# Patient Record
Sex: Female | Born: 1990
Health system: Southern US, Community
[De-identification: ages and names within clinical notes are randomized; demographics above are authoritative.]

## PROBLEM LIST (undated history)

## (undated) ENCOUNTER — Inpatient Hospital Stay (HOSPITAL_COMMUNITY): Payer: Self-pay

## (undated) DIAGNOSIS — I951 Orthostatic hypotension: Principal | ICD-10-CM

## (undated) DIAGNOSIS — F319 Bipolar disorder, unspecified: Secondary | ICD-10-CM

## (undated) DIAGNOSIS — F32A Depression, unspecified: Secondary | ICD-10-CM

## (undated) DIAGNOSIS — F329 Major depressive disorder, single episode, unspecified: Secondary | ICD-10-CM

## (undated) DIAGNOSIS — R000 Tachycardia, unspecified: Principal | ICD-10-CM

## (undated) HISTORY — PX: TONSILLECTOMY: SUR1361

## (undated) HISTORY — DX: Tachycardia, unspecified: R00.0

## (undated) HISTORY — DX: Orthostatic hypotension: I95.1

---
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
## 2008-06-23 ENCOUNTER — Other Ambulatory Visit: Admission: RE | Admit: 2008-06-23 | Discharge: 2008-06-23 | Payer: Self-pay | Admitting: Family Medicine

## 2008-12-31 ENCOUNTER — Encounter: Admission: RE | Admit: 2008-12-31 | Discharge: 2008-12-31 | Payer: Self-pay | Admitting: Gastroenterology

## 2009-07-12 ENCOUNTER — Other Ambulatory Visit: Admission: RE | Admit: 2009-07-12 | Discharge: 2009-07-12 | Payer: Self-pay | Admitting: Family Medicine

## 2009-08-25 ENCOUNTER — Encounter: Payer: Self-pay | Admitting: Internal Medicine

## 2009-10-18 ENCOUNTER — Encounter: Payer: Self-pay | Admitting: Internal Medicine

## 2009-11-03 ENCOUNTER — Encounter: Admission: RE | Admit: 2009-11-03 | Discharge: 2009-11-03 | Payer: Self-pay | Admitting: Family Medicine

## 2009-11-09 ENCOUNTER — Encounter: Payer: Self-pay | Admitting: Internal Medicine

## 2009-11-10 ENCOUNTER — Encounter: Payer: Self-pay | Admitting: Internal Medicine

## 2009-11-10 DIAGNOSIS — R55 Syncope and collapse: Secondary | ICD-10-CM | POA: Insufficient documentation

## 2009-11-10 DIAGNOSIS — R079 Chest pain, unspecified: Secondary | ICD-10-CM | POA: Insufficient documentation

## 2009-11-15 ENCOUNTER — Telehealth: Payer: Self-pay | Admitting: Internal Medicine

## 2009-11-18 DIAGNOSIS — R002 Palpitations: Secondary | ICD-10-CM | POA: Insufficient documentation

## 2009-11-25 ENCOUNTER — Emergency Department (HOSPITAL_COMMUNITY): Admission: EM | Admit: 2009-11-25 | Discharge: 2009-11-25 | Payer: Self-pay | Admitting: Emergency Medicine

## 2009-12-07 ENCOUNTER — Encounter: Payer: Self-pay | Admitting: Internal Medicine

## 2009-12-13 ENCOUNTER — Encounter: Payer: Self-pay | Admitting: Internal Medicine

## 2009-12-13 ENCOUNTER — Telehealth: Payer: Self-pay | Admitting: Internal Medicine

## 2009-12-21 ENCOUNTER — Ambulatory Visit (HOSPITAL_COMMUNITY): Admission: RE | Admit: 2009-12-21 | Discharge: 2009-12-21 | Payer: Self-pay | Admitting: Internal Medicine

## 2009-12-21 ENCOUNTER — Ambulatory Visit: Payer: Self-pay | Admitting: Internal Medicine

## 2010-01-06 ENCOUNTER — Ambulatory Visit (HOSPITAL_COMMUNITY): Admission: RE | Admit: 2010-01-06 | Discharge: 2010-01-06 | Payer: Self-pay | Admitting: Internal Medicine

## 2010-01-06 ENCOUNTER — Ambulatory Visit: Payer: Self-pay | Admitting: Internal Medicine

## 2010-04-04 ENCOUNTER — Encounter: Payer: Self-pay | Admitting: Internal Medicine

## 2010-04-11 ENCOUNTER — Ambulatory Visit: Payer: Self-pay | Admitting: Internal Medicine

## 2010-06-02 ENCOUNTER — Telehealth: Payer: Self-pay | Admitting: Internal Medicine

## 2010-06-09 ENCOUNTER — Telehealth: Payer: Self-pay | Admitting: Internal Medicine

## 2010-06-16 ENCOUNTER — Telehealth: Payer: Self-pay | Admitting: Internal Medicine

## 2010-06-16 ENCOUNTER — Ambulatory Visit: Payer: Self-pay | Admitting: Internal Medicine

## 2010-07-14 ENCOUNTER — Other Ambulatory Visit: Admission: RE | Admit: 2010-07-14 | Discharge: 2010-07-14 | Payer: Self-pay | Admitting: Family Medicine

## 2010-09-15 ENCOUNTER — Telehealth: Payer: Self-pay | Admitting: Internal Medicine

## 2010-10-25 NOTE — Progress Notes (Signed)
Summary: CALLING SCHEDULE A TILT TABLE TEST  Phone Note Call from Patient Call back at Home Phone [phone]   Caller: Patient Summary of Call: PT CALLING TO  SCHEDULE A TILT TABLE TEST. Initial call taken by: Judie Grieve,  November 15, 2009 2:32 PM  Follow-up for Phone Call        Pt needs to be seen by EP doctor before setting up tilt test.  Will have Marcelino Duster in scheduling call pt back and make sure she is aware.  She has a scheduled apt for 12/23/09 with Dr Ladona Ridgel Dennis Bast, RN, BSN  November 15, 2009 3:27 PM Dr Donnie Aho spoke with Dr Ladona Ridgel and Dr Ladona Ridgel has agreed to do Tilt Test without seeing pt in office first.  Tried to call pt to srt up.  left message on machine for her to call me to arrange test. Dennis Bast, RN, BSN  December 03, 2009 3:20 PM  returning call, please call pt back @ [phone], [name]  December 03, 2009 3:29 PM   Additional Follow-up for Phone Call Additional follow up Details #1::        Called patient and left message on machine [employer] RN BSN  December 07, 2009 12:08 PM  Called and let Dr York Spaniel nurse know that we can not get in touch with pt.  Left another message for her to call.  spoke with Dr Ladona Ridgel  he says let the pt call us. Dennis Bast, RN, BSN  December 10, 2009 5:07 PM

## 2010-10-25 NOTE — Miscellaneous (Signed)
  Clinical Lists Changes  Problems: Added new problem of SYNCOPE (ICD-780.2) Added new problem of CHEST PAIN UNSPECIFIED (ICD-786.50) Orders: Added new Referral order of EP Referral (Cardiology EP Ref ) - Signed Added new Referral order of CPX Test at [facility] (CPX Test) - Signed

## 2010-10-25 NOTE — Progress Notes (Signed)
Summary: question about medication  Phone Note Call from Patient Call back at Home Phone [phone]   Caller: Patient Summary of Call: Question about medication Initial call taken by: Judie Grieve,  June 02, 2010 8:39 AM  Follow-up for Phone Call        [facility] Scherrie Bateman, LPN  June 02, 2010 9:17 AM  pt returning nurse call [address]  June 03, 2010 11:48 AM  Pt. concerned that her new medication, Risperdal, would interfere with her diagnosis of POTS syndrome. After reviewing the S/E of this medication, I do not see any indications that prevent her from taking the medication. Advised her to carefully pay attention to her symtoms especially the syncopal episodes to make sure they do not get worse but if they do to call us for further evaluation of the medication.

## 2010-10-25 NOTE — Progress Notes (Signed)
Summary: request regarding health  Phone Note Call from Patient Call back at Home Phone [phone]   Caller: Patient Summary of Call: Pt request call regarding health Initial call taken by: Judie Grieve,  June 09, 2010 10:30 AM  Follow-up for Phone Call        pt state pots symptoms increasing again. still have cp daily but intesity greater. rsc from 10/20 to 9/22. Claris Gladden, RN, BSN

## 2010-10-25 NOTE — Letter (Signed)
Summary: [facility]  [facility] Note   Imported By: Roderic Ovens 12/27/2009 16:50:44  _____________________________________________________________________  External Attachment:    Type:   Image     Comment:   External Document

## 2010-10-25 NOTE — Letter (Signed)
Summary: [facility]  [facility] Note   Imported By: Roderic Ovens 12/30/2009 11:29:07  _____________________________________________________________________  External Attachment:    Type:   Image     Comment:   External Document

## 2010-10-25 NOTE — Letter (Signed)
Summary: Tilt Table Test  [facility], Main Office  [address]   [place], [place] [address]   Phone: [phone]  Fax: [phone]      Tilt Table Test   You are scheduled for a Tilt Table Test on 12/21/09 at 10:30am with Dr.Taylor.  1.  Please arrive at the [facility] at 8:00am on the day of your procedure.  This test will take approximately 2-3 hours.  Please bring your insurance cards with you to the hospital.    2.  Complete lab work at the hospital the day of test You do not have to fast.  3.  Do not have anything to eat or drink after midnight the night before your procedure.  4.  Remaining medications may be taken with a small amount of water. Allergies:  Codeine  5.  Please wear comfortable shoes and clothing (sneakers and slacks are preferred).  6.  An IV will be placed in order to give fluids and medicines if necessary.  7.  You may NOT drive home after the test.  Please make arrangements with family or a friend to take you home after the procedure.   9.  Wash your chest and neck with antibacterial soap the evening before and the morning of your procedure.  Rinse well.  *If you have ANY questions after you get home, please call the office at [phone]. Anselm Pancoast  *Every attempt is made to prevent procedures from being rescheduled.  Due to the nature of Electrophysiology, rescheduling can happen.  The physician is always aware and directs the staff when this occurs.

## 2010-10-25 NOTE — Letter (Signed)
Summary: [facility] Office Note  [facility] Office Note   Imported By: Lynford Humphrey [place] 12/30/2009 11:35:00  _____________________________________________________________________  External Attachment:    Type:   Image     Comment:   External Document

## 2010-10-25 NOTE — Assessment & Plan Note (Signed)
Summary: rov   Visit Type:  Follow-up Primary Provider:  Candise Bowens. Yehuda Mao, MD  CC:  Pt stopped taking Nadolol..  History of Present Illness: Carly Reyes is seen in followup for symptoms consistent with dysautonomia aggravated significantly by anxiety.  After we saw her a couple of months ago he began salt and fluid repletion with Gatorade salt tablets recumbent exercise. There is a dramatic improvement in her symptoms and in fact she felt better than she had in 6 years. She returned to work after her leave of absence expired a couple weeks later. After 3 or 4 days her symptoms began to return and have gradually progressed so she is now having recurrent syncope and she is quite aware of the high levels of anxiety associated with her work situation.  She is also taking the knee she is seeing a counselor/psychiatrist. Respirdal was recently added to her medical regime and this is concurrent with the worsening of her syncope  Current Medications (verified): 1)  Ocella 3-0.03 Mg Tabs (Drospirenone-Ethinyl Estradiol) .... Take One Tablet By Mouth Once Daily. 2)  Aspirin 81 Mg Tabs (Aspirin) .... Take One Tablet Once Daily 3)  Zoloft 100 Mg Tabs (Sertraline Hcl) .... Take One Tablet With 1/2 50 Mg Tablet 4)  Risperdal 0.25 Mg Tabs (Risperidone) .... Once Daily  Allergies: 1)  ! Codeine  Past History:  Past Medical History: Last updated: 11/18/2009 Current Problems:  PALPITATIONS (ICD-785.1) CHEST PAIN UNSPECIFIED (ICD-786.50) SYNCOPE (ICD-780.2)    Vital Signs:  Patient profile:   20 year old female Height:      67 inches Weight:      129 pounds BMI:     20.28 Pulse rate:   85 / minute BP sitting:   126 / 80  (left arm)  Vitals Entered By: Laurance Flatten CMA (June 16, 2010 12:48 PM) CC: Pt stopped taking Nadolol.   Physical Exam  General:  Well developed, well nourished, in no acute distress.   EKG  Procedure date:  06/16/2010  Findings:      sinus rhythm at  85 Intervals 0.12/0.08/0.39 Axis LXXVII Otherwise normal  Impression & Recommendations:  Problem # 1:  DYSAUTONOMIA (ICD-742.8) the patient continues to have symptoms of dysautonomia. There is a marked improvement initially and then worsening with return to work. There seems to be a great deal of anxiety associated with return to the work place.  She is working with the psychiatry team. I've asked her to query of them the possibility of medications that might help alleviate the anxiety in addition to the antidepressant effects in addition I have asked her to ask her herself whether or counseling in the context of a religious world you might be of benefit. We outlined the number of potentially catastrophic contributions to anxiety including abuse abortion drugs etc. all of which need to be considered.  We will see her again in 4 weeks as scheduled.

## 2010-10-25 NOTE — Progress Notes (Signed)
Summary: sch tilt table test  Phone Note Call from Patient Call back at Home Phone [phone]   Caller: Patient Reason for Call: Talk to Nurse Summary of Call: returning call Initial call taken by: [name],  December 13, 2009 2:21 PM  Follow-up for Phone Call        left message on machine for pt.  As I was leaving message she called back and said she never got any of our messages we had left for her regarding scheduling the Tilt test.  Will sch for 12/21/09. Dennis Bast, RN, BSN  December 13, 2009 2:32 PM Got test scheduled and called pt back left her message on voicemail to call me for her instructions. Dennis Bast, RN, BSN  December 13, 2009 3:57 PM spoke with pt gave her instructions over the phone Dennis Bast, RN, BSN  December 14, 2009 6:06 PM

## 2010-10-25 NOTE — Assessment & Plan Note (Signed)
Summary: nep/ pot/  pt has bcbs/ gd   CC:  nep/POTS/pt states symptoms have gotten gradually worse.  Especially worse in the last 4 weeks.  Pt not sure the cause.  Pt states she has had bouts with syncope since tilt table and but reports that the chest pain is what has gotten much worse.  History of Present Illness: Carly Reyes is seen at the request of Dr. Donnie Aho because of dysautonomia.  She is a 20 year old woman who has a history of syncope that dates back to March 2010. This first episode occurred a couple weeks after breaking up with her boyfriend of 3-1/2 years. It was associated with a sense of impending vomiting. She went to the bathroom collapsed in the bathroom and hit her head on the fixtures of the bathroom. She has continued to have episodes of syncope increasingly abrupt in onset. They mostly occur in the morning shortly but not immediately after awakening. She has been advised to increase her fluid intake when she first awakens, this without significant benefit. She showers in the morning but mostly after these episodes have occurred. Her a.m. diet is cold and small in volume. The recovery phase is characterized by confusion, fatigue and some  orthostatic intolerance.  She underwent tilt table testing by Dr. Ladona Ridgel. He described in his records A. increase in heart rate from the 90s to the 130 range and up into the 140 range. There was no loss of consciousness. the rest of her cardiac evaluation has included an ultrasound by Dr. Chales Abrahams that was normal, a 30 day event recorder by Dr. Donnie Aho that was normal, and a cardiopulmonary stress test by Dr. Dorthea Cove that was also normal. This latter was the development of chest pain associated with shortness of breath. This began last fall shortly after the initiation of Adderall which was started for ADD. She is continued to have problems with chest pain. She has been seen by GI an endoscopy that was normal, a trial of PPI therapy was unhelpful She  has been seen by her primary care physician multiple times and a diagnosis of "anxiety" has been made. She is been treated with anxiolytics as well as more recently with Zoloft. 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 The patient says that this as well as her history of panic attacks which she related to her prior relationships were "cured."  Current Medications (verified): 1)  Ocella 3-0.03 Mg Tabs (Drospirenone-Ethinyl Estradiol) .... Take One Tablet By Mouth Once Daily. 2)  Aspirin 81 Mg Tabs (Aspirin) .... Take One Tablet Once Daily 3)  Zoloft 100 Mg Tabs (Sertraline Hcl) .... Take One Tablet With 1/2 50 Mg Tablet 4)  Zoloft 50 Mg Tabs (Sertraline Hcl) .... Take 1/2 Tablet With A 100 Mg Tablets Once Daily 5)  Nadolol 20 Mg Tabs (Nadolol) .... Take One Tablet Once Daily  Allergies (verified): 1)  ! Codeine  Past History:  Past Medical History: Last updated: 11/18/2009 Current Problems:  PALPITATIONS (ICD-785.1) CHEST PAIN UNSPECIFIED (ICD-786.50) SYNCOPE (ICD-780.2)    Past Surgical History: Last updated: 11/18/2009 Tonsillectomy  Family History: Last updated: 04/11/2010 Negative FH of Diabetes, Hypertension, or Coronary Artery Disease  Social History: Last updated: 11/18/2009 Full Time Single  Tobacco Use - No.  Alcohol Use - no Regular Exercise - no  Family History: Negative FH of Diabetes, Hypertension, or Coronary Artery Disease  Vital Signs:  Patient profile:   20 year old female Height:      62  inches Weight:      129 pounds BMI:     20.28 Pulse rate:   98 / minute Pulse rhythm:   regular BP sitting:   124 / 81  (left arm) Cuff size:   regular  Vitals Entered By: Judithe Modest CMA (April 11, 2010 10:01 AM)  Physical Exam  General:  Well developed, well nourished, in no acute distress. Head:  normal HEENT Neck:  supple without thyromegaly Chest Wall:  no cva tenderness Lungs:  clear Heart:  regular rate and rhythm without  murmurs or gallups Abdomen:  soft nontender without midline pulsation or hepatomegaly Msk:  Back normal, normal gait. Muscle strength and tone normal. Pulses:  pulses normal in all 4 extremities Extremities:  No clubbing or cyanosis. Neurologic:  Alert and oriented x 3. Skin:  Intact without lesions or rashes. Cervical Nodes:  no LN Psych:  affect is engaging but unblinking   EKG  Procedure date:  04/04/2010  Findings:      sinus rhythm at 67 Intervals 0.13/0.07/0.42 Otherwise normal  Impression & Recommendations:  Problem # 1:  DYSAUTONOMIA (ICD-742.8) the patient has signs and symptoms consistent with POTS. We spent a long time discussing the pathophysiology particularly the importance of salt intake which has been deplete in her diet.  we also discussed the importance of exercise chiefly horizontal exercise. I gave her information for the POTS place and NDRF websites. I have also encouraged her to ask her older adults in her life as to whether anxiety/stress may be playing a part here it is a common contributor to her dysautonomia and will need attention if indeed present and contributing  Problem # 2:  PALPITATIONS (ICD-785.1) her palpitations are likely related to the above. Her now all dose is currently insufficient to help with her heart rate; however, her blood pressure is so low that higher doses are probably not yet attainable. Her updated medication list for this problem includes:    Aspirin 81 Mg Tabs (Aspirin) .Marland Kitchen... Take one tablet once daily    Nadolol 20 Mg Tabs (Nadolol) .Marland Kitchen... Take one tablet once daily  Problem # 3:  CHEST PAIN UNSPECIFIED (ICD-786.50) chest pain is likely consistent with her POTS. I would be inclined to dis continue her aspirin Her updated medication list for this problem includes:    Aspirin 81 Mg Tabs (Aspirin) .Marland Kitchen... Take one tablet once daily    Nadolol 20 Mg Tabs (Nadolol) .Marland Kitchen... Take one tablet once daily  Problem # 4:  SYNCOPE  (ICD-780.2) as above.  we also discussed therapeutic maneuvers which might help mitigate some of her syncope. Specifically, she is advised to raise the head of her bed 6 inches, 2 change her fluid intake from water to Gatorade/Powerade, and to obtain salt tablets which are available at [facility] and to take 2 of them her day. She was further encouraged to add salt to her food although she is somewhat reluctant to do this. She is also advised regarding the impact of heat on her condition and I further suggested that she talk to her gynecologist about going on birth control that might help to decrease her cyclical bleeding which has been aggravation factors for her symptoms Her updated medication list for this problem includes:    Aspirin 81 Mg Tabs (Aspirin) .Marland Kitchen... Take one tablet once daily    Nadolol 20 Mg Tabs (Nadolol) .Marland Kitchen... Take one tablet once daily

## 2010-10-25 NOTE — Progress Notes (Signed)
Summary: coming out of work for a month  Phone Note Call from Patient Call back at [employer] [phone]   Caller: Patient Reason for Call: Talk to Nurse Summary of Call: per pt called was seen today, wants coming out of work for about a month until she can get better.  Initial call taken by: Lorne Skeens,  June 16, 2010 4:01 PM  Follow-up for Phone Call        per Dr. Graciela Husbands pt needs to follow-up w/ PCP regarding leave of absence from work Follow-up by: Claris Gladden RN,  June 16, 2010 4:10 PM

## 2010-10-25 NOTE — Letter (Signed)
Summary: [facility]  [facility]   Imported By: Marylou Mccoy 04/22/2010 11:29:26  _____________________________________________________________________  External Attachment:    Type:   Image     Comment:   External Document

## 2010-10-27 NOTE — Progress Notes (Signed)
Summary: chest pain and sob  Phone Note Call from Patient Call back at Home Phone [phone]   Caller: Patient Reason for Call: Talk to Nurse Summary of Call: pt states she been having chest pain and sob for 3 days. she would like to talk to a nurse.   Initial call taken by: Roe Coombs,  September 15, 2010 2:01 PM  Follow-up for Phone Call        I talked with pt by telephone--pt states pain in the middle of her chest for 3 days--this has occurred off and on--sharp stabbing chest pain that lasts for 2-3 seconds associated with SOB and a little more dizziness,  , otherwise OK --she is asking if she should come for echo --I will forward to Dr Graciela Husbands for review Luana Shu

## 2010-12-19 LAB — URINALYSIS, ROUTINE W REFLEX MICROSCOPIC
Bilirubin Urine: NEGATIVE
Glucose, UA: NEGATIVE mg/dL
Hgb urine dipstick: NEGATIVE
Ketones, ur: NEGATIVE mg/dL
Nitrite: NEGATIVE
Protein, ur: NEGATIVE mg/dL
Specific Gravity, Urine: 1.014 (ref 1.005–1.030)
Urobilinogen, UA: 0.2 mg/dL (ref 0.0–1.0)
pH: 7 (ref 5.0–8.0)

## 2010-12-19 LAB — COMPREHENSIVE METABOLIC PANEL
ALT: 17 U/L (ref 0–35)
AST: 20 U/L (ref 0–37)
Albumin: 3.7 g/dL (ref 3.5–5.2)
Alkaline Phosphatase: 65 U/L (ref 39–117)
BUN: 6 mg/dL (ref 6–23)
CO2: 24 mEq/L (ref 19–32)
Calcium: 9 mg/dL (ref 8.4–10.5)
Chloride: 105 mEq/L (ref 96–112)
Creatinine, Ser: 0.6 mg/dL (ref 0.4–1.2)
GFR calc Af Amer: >60 mL/min (ref >60)
GFR calc non Af Amer: >60 mL/min (ref >60)
Glucose, Bld: 91 mg/dL (ref 70–99)
Potassium: 3.3 mEq/L — ABNORMAL LOW (ref 3.5–5.1)
Sodium: 136 mEq/L (ref 135–145)
Total Bilirubin: 0.7 mg/dL (ref 0.3–1.2)
Total Protein: 6.8 g/dL (ref 6.0–8.3)

## 2010-12-19 LAB — CBC
HCT: 36.7 % (ref 36.0–46.0)
Hemoglobin: 12.7 g/dL (ref 12.0–15.0)
MCHC: 34.5 g/dL (ref 30.0–36.0)
MCV: 86 fL (ref 78.0–100.0)
Platelets: 222 10*3/uL (ref 150–400)
RBC: 4.27 MIL/uL (ref 3.87–5.11)
RDW: 13.1 % (ref 11.5–15.5)
WBC: 4.2 10*3/uL (ref 4.0–10.5)

## 2010-12-19 LAB — DIFFERENTIAL
Basophils Absolute: 0 10*3/uL (ref 0.0–0.1)
Basophils Relative: 1 % (ref 0–1)
Eosinophils Absolute: 0 10*3/uL (ref 0.0–0.7)
Eosinophils Relative: 1 % (ref 0–5)
Lymphocytes Relative: 40 % (ref 12–46)
Lymphs Abs: 1.7 10*3/uL (ref 0.7–4.0)
Monocytes Absolute: 0.3 10*3/uL (ref 0.1–1.0)
Monocytes Relative: 8 % (ref 3–12)
Neutro Abs: 2.1 10*3/uL (ref 1.7–7.7)
Neutrophils Relative %: 50 % (ref 43–77)

## 2010-12-19 LAB — HCG, SERUM, QUALITATIVE: Preg, Serum: NEGATIVE

## 2010-12-19 LAB — POCT PREGNANCY, URINE: Preg Test, Ur: NEGATIVE

## 2010-12-19 LAB — RAPID URINE DRUG SCREEN, HOSP PERFORMED
Amphetamines: NOT DETECTED
Barbiturates: NOT DETECTED
Benzodiazepines: NOT DETECTED
Cocaine: NOT DETECTED
Opiates: NOT DETECTED
Tetrahydrocannabinol: NOT DETECTED

## 2010-12-19 LAB — D-DIMER, QUANTITATIVE: D-Dimer, Quant: <0.22 ug/mL-FEU (ref 0.00–0.48)

## 2012-09-24 ENCOUNTER — Other Ambulatory Visit: Payer: Self-pay | Admitting: Family Medicine

## 2012-09-24 ENCOUNTER — Other Ambulatory Visit (HOSPITAL_COMMUNITY)
Admission: RE | Admit: 2012-09-24 | Discharge: 2012-09-24 | Disposition: A | Discharge status: Home / Self Care | Payer: Self-pay | Source: Ambulatory Visit | Attending: Family Medicine | Admitting: Family Medicine

## 2012-09-24 DIAGNOSIS — Z124 Encounter for screening for malignant neoplasm of cervix: Secondary | ICD-10-CM | POA: Insufficient documentation

## 2013-06-12 LAB — OB RESULTS CONSOLE HIV ANTIBODY (ROUTINE TESTING): HIV: NONREACTIVE

## 2013-06-12 LAB — OB RESULTS CONSOLE RUBELLA ANTIBODY, IGM: Rubella: IMMUNE

## 2013-06-27 LAB — OB RESULTS CONSOLE ABO/RH: RH Type: POSITIVE

## 2013-06-27 LAB — OB RESULTS CONSOLE ANTIBODY SCREEN: Antibody Screen: NEGATIVE

## 2013-06-27 LAB — OB RESULTS CONSOLE HEPATITIS B SURFACE ANTIGEN: Hepatitis B Surface Ag: NEGATIVE

## 2013-06-27 LAB — OB RESULTS CONSOLE RPR: RPR: NONREACTIVE

## 2013-06-30 ENCOUNTER — Emergency Department (HOSPITAL_COMMUNITY)
Admission: EM | Admit: 2013-06-30 | Discharge: 2013-06-30 | Disposition: A | Discharge status: Home / Self Care | Payer: Self-pay | Attending: Emergency Medicine | Admitting: Emergency Medicine

## 2013-06-30 ENCOUNTER — Encounter (HOSPITAL_COMMUNITY): Payer: Self-pay

## 2013-06-30 DIAGNOSIS — R0789 Other chest pain: Secondary | ICD-10-CM | POA: Insufficient documentation

## 2013-06-30 DIAGNOSIS — Z87891 Personal history of nicotine dependence: Secondary | ICD-10-CM | POA: Insufficient documentation

## 2013-06-30 DIAGNOSIS — F319 Bipolar disorder, unspecified: Secondary | ICD-10-CM | POA: Insufficient documentation

## 2013-06-30 DIAGNOSIS — Z8619 Personal history of other infectious and parasitic diseases: Secondary | ICD-10-CM | POA: Insufficient documentation

## 2013-06-30 DIAGNOSIS — R0602 Shortness of breath: Secondary | ICD-10-CM | POA: Insufficient documentation

## 2013-06-30 DIAGNOSIS — Z79899 Other long term (current) drug therapy: Secondary | ICD-10-CM | POA: Insufficient documentation

## 2013-06-30 DIAGNOSIS — R002 Palpitations: Secondary | ICD-10-CM | POA: Insufficient documentation

## 2013-06-30 DIAGNOSIS — R079 Chest pain, unspecified: Secondary | ICD-10-CM

## 2013-06-30 HISTORY — DX: Bipolar disorder, unspecified: F31.9

## 2013-06-30 LAB — COMPREHENSIVE METABOLIC PANEL
ALT: 25 U/L (ref 0–35)
AST: 15 U/L (ref 0–37)
Albumin: 3.9 g/dL (ref 3.5–5.2)
Alkaline Phosphatase: 72 U/L (ref 39–117)
BUN: 5 mg/dL — ABNORMAL LOW (ref 6–23)
CO2: 25 mEq/L (ref 19–32)
Calcium: 9.5 mg/dL (ref 8.4–10.5)
Chloride: 100 mEq/L (ref 96–112)
Creatinine, Ser: 0.54 mg/dL (ref 0.50–1.10)
GFR calc Af Amer: >90 mL/min (ref >90)
GFR calc non Af Amer: >90 mL/min (ref >90)
Glucose, Bld: 92 mg/dL (ref 70–99)
Potassium: 3.2 mEq/L — ABNORMAL LOW (ref 3.5–5.1)
Sodium: 136 mEq/L (ref 135–145)
Total Bilirubin: 0.2 mg/dL — ABNORMAL LOW (ref 0.3–1.2)
Total Protein: 7.2 g/dL (ref 6.0–8.3)

## 2013-06-30 LAB — URINALYSIS, ROUTINE W REFLEX MICROSCOPIC
Bilirubin Urine: NEGATIVE
Glucose, UA: NEGATIVE mg/dL
Hgb urine dipstick: NEGATIVE
Ketones, ur: NEGATIVE mg/dL
Leukocytes, UA: NEGATIVE
Nitrite: NEGATIVE
Protein, ur: NEGATIVE mg/dL
Specific Gravity, Urine: 1.021 (ref 1.005–1.030)
Urobilinogen, UA: 0.2 mg/dL (ref 0.0–1.0)
pH: 7 (ref 5.0–8.0)

## 2013-06-30 LAB — TROPONIN I: Troponin I: <0.3 ng/mL (ref <0.30)

## 2013-06-30 LAB — CBC
HCT: 33.9 % — ABNORMAL LOW (ref 36.0–46.0)
Hemoglobin: 12 g/dL (ref 12.0–15.0)
MCH: 29.1 pg (ref 26.0–34.0)
MCHC: 35.4 g/dL (ref 30.0–36.0)
MCV: 82.3 fL (ref 78.0–100.0)
Platelets: 309 10*3/uL (ref 150–400)
RBC: 4.12 MIL/uL (ref 3.87–5.11)
RDW: 12.7 % (ref 11.5–15.5)
WBC: 10.4 10*3/uL (ref 4.0–10.5)

## 2013-06-30 LAB — D-DIMER, QUANTITATIVE: D-Dimer, Quant: <0.27 ug/mL-FEU (ref 0.00–0.48)

## 2013-06-30 MED ORDER — FAMOTIDINE 20 MG PO TABS: 20.0000 mg | ORAL_TABLET | Freq: Two times a day (BID) | ORAL | Status: DC

## 2013-06-30 MED ORDER — SODIUM CHLORIDE 0.9 % IV BOLUS (SEPSIS)
1000.0000 mL | Freq: Once | INTRAVENOUS | Status: AC
  Administered 2013-06-30: 1000 mL via INTRAVENOUS

## 2013-06-30 MED ORDER — POTASSIUM CHLORIDE CRYS ER 20 MEQ PO TBCR: 20.0000 meq | EXTENDED_RELEASE_TABLET | Freq: Every day | ORAL | Status: DC

## 2013-06-30 MED ORDER — POTASSIUM CHLORIDE CRYS ER 20 MEQ PO TBCR
40.0000 meq | EXTENDED_RELEASE_TABLET | Freq: Once | ORAL | Status: AC
  Administered 2013-06-30: 40 meq via ORAL
  Filled 2013-06-30: qty 2

## 2013-06-30 NOTE — ED Notes (Signed)
Patient has a history of POTTS and is [redacted] weeks pregnant. Patient states she had a HR this AM 130 and currently HR-90. Patient is also having chest pain.

## 2013-06-30 NOTE — ED Notes (Signed)
[place]augh, PA at bedside.  

## 2013-06-30 NOTE — Progress Notes (Signed)
[facility] provided pt with a list of primary care resources. Patient stated that she was pending Medicaid.  °

## 2013-06-30 NOTE — ED Provider Notes (Signed)
CSN: [id number]     Arrival date & time 06/30/13  1300 History   First MD Initiated Contact with Patient 06/30/13 1505     Chief Complaint  Patient presents with  . Chest Pain  . Shortness of Breath   (Consider location/radiation/quality/duration/timing/severity/associated sxs/prior Treatment) Patient is a 22 y.o. female presenting with chest pain and shortness of breath. The history is provided by the patient, a parent and a significant other. No language interpreter was used.  Chest Pain Associated symptoms: palpitations and shortness of breath   Associated symptoms: no abdominal pain, no back pain, no cough, no diaphoresis, no fatigue, no fever, no headache, no nausea and not vomiting   Shortness of Breath Associated symptoms: chest pain   Associated symptoms: no abdominal pain, no cough, no diaphoresis, no fever, no headaches, no neck pain, no rash, no vomiting and no wheezing     BRYCELYNN STAMPLEY is a 22 y.o. female  with a hx of POTTS, bipolar disorder, anxiety  presents to the Emergency Department complaining of gradual, waxing and waning but never resolving sharp chest pain and associated shortness of breath beginning this morning upon wakening and 9 AM. Patient states she took clonazepam 1 mg 4 times per day for anxiety prior to finding out she was pregnant. She reports 2 weeks ago she stopped clonazepam without a taper and had chest pain, shortness of breath, tremors, diaphoresis, nausea, vomiting and diarrhea. She states when she talked to her psychiatrist about this 2 days ago he told her she was withdrawing and put her back on the clonazepam 0.5 mg every 12 hours.  She reports this morning when she woke her chest pain and shortness of breath felt like her usual POTTS and not like her anxiety, but she became anxious and took 2 extra 0.5mg  Klonopin today. She states her symptoms have resolved except for her shortness of breath which persists.  She reports multiple episodes of vomiting  per day (up to 6 per day) before restarting in the clonazepam 2 days ago and less than 2 per day since that time.  She reports her OB/GYN is monitoring her clonazepam taper and prescribed Phenergan for her vomiting. She reports she is drinking plenty of water, but is not taking in extra salt. Patient reports this morning she attempted drinking water, putting her feet up and nothing seemed to make her symptoms better or worse. She reports her heart rate was 130 this morning prior to taking her extra doses of clonazepam.  She reports she sees [facility] who initially had her on nadolol for her POTTS, but this caused her BP to be too low therefore she was removed from this medication.  She currently takes no medications for her POTTS.  She also reports "acid wash" worse in the mornings and evenings not relieved by Tums.   Past Medical History  Diagnosis Date  . Pott's disease     . Bipolar 1 disorder    Past Surgical History  Procedure Laterality Date  . Tonsillectomy     Family History  Problem Relation Age of Onset  . Hypertension Mother   . Hypertension Father   . Heart failure Father   . Thyroid disease Father   . Graves' disease Father    History  Substance Use Topics  . Smoking status: Former Games developer  . Smokeless tobacco: Never Used  . Alcohol Use: No   OB History   Grav Para Term Preterm Abortions TAB SAB Ect Mult Living  1              Review of Systems  Constitutional: Negative for fever, diaphoresis, appetite change, fatigue and unexpected weight change.  HENT: Negative for mouth sores, neck pain and neck stiffness.   Eyes: Negative for visual disturbance.  Respiratory: Positive for shortness of breath. Negative for cough, chest tightness, wheezing and stridor.   Cardiovascular: Positive for chest pain and palpitations.  Gastrointestinal: Negative for nausea, vomiting, abdominal pain, diarrhea and constipation.  Endocrine: Negative for polydipsia, polyphagia and  polyuria.  Genitourinary: Negative for dysuria, urgency, frequency and hematuria.  Musculoskeletal: Negative for back pain and gait problem.  Skin: Negative for rash.  Allergic/Immunologic: Negative for immunocompromised state.  Neurological: Negative for syncope, light-headedness and headaches.  Hematological: Does not bruise/bleed easily.  Psychiatric/Behavioral: Negative for sleep disturbance. The patient is not nervous/anxious.     Allergies  Lactose intolerance (gi)  Home Medications   Current Outpatient Rx  Name  Route  Sig  Dispense  Refill  . calcium carbonate (TUMS - DOSED IN MG ELEMENTAL CALCIUM) 500 MG chewable tablet   Oral   Chew 1 tablet by mouth 2 (two) times daily as needed for heartburn.         . clonazePAM (KLONOPIN) 0.5 MG tablet   Oral   Take 0.5 mg by mouth 3 (three) times daily as needed for anxiety.         . lamoTRIgine (LAMICTAL) 200 MG tablet   Oral   Take 200 mg by mouth daily.         . Prenatal Vit-Fe Fumarate-FA (PRENATAL MULTIVITAMIN) TABS tablet   Oral   Take 1 tablet by mouth daily at 12 noon.         . promethazine (PHENERGAN) 25 MG tablet   Oral   Take 25 mg by mouth every 6 (six) hours as needed for nausea.         Marland Kitchen QUEtiapine (SEROQUEL) 200 MG tablet   Oral   Take 200 mg by mouth at bedtime.         . sertraline (ZOLOFT) 100 MG tablet   Oral   Take 100 mg by mouth daily.         . potassium chloride SA (K-DUR,KLOR-CON) 20 MEQ tablet   Oral   Take 1 tablet (20 mEq total) by mouth daily.   3 tablet   0    BP 122/75  Pulse 98  Temp(Src) 98.4 F (36.9 C) (Oral)  Resp 16  SpO2 98% Physical Exam  Nursing note and vitals reviewed. Constitutional: She appears well-developed and well-nourished. No distress.  Awake, alert, nontoxic appearance, well appeaering  HENT:  Head: Normocephalic and atraumatic.  Mouth/Throat: Oropharynx is clear and moist. No oropharyngeal exudate.  Eyes: Conjunctivae are normal.  Pupils are equal, round, and reactive to light. No scleral icterus.  Neck: Normal range of motion. Neck supple.  Cardiovascular: Normal rate, regular rhythm, S1 normal, S2 normal, normal heart sounds and intact distal pulses.   No murmur heard. Pulses:      Radial pulses are 2+ on the right side, and 2+ on the left side.       Dorsalis pedis pulses are 2+ on the right side, and 2+ on the left side.       Posterior tibial pulses are 2+ on the right side, and 2+ on the left side.  Capillary refill < 3 sec  Pulmonary/Chest: Effort normal and breath sounds normal. No accessory muscle  usage. Not tachypneic. No respiratory distress. She has no decreased breath sounds. She has no wheezes. She has no rhonchi. She has no rales. She exhibits no tenderness and no bony tenderness.  No respiratory distress  Abdominal: Soft. Normal appearance and bowel sounds are normal. She exhibits no mass. There is no tenderness. There is no rigidity, no rebound, no guarding and no CVA tenderness.  Musculoskeletal: Normal range of motion. She exhibits no edema.  No peripheral edema No calf tenderness bilaterally Negative Homan's sign bilaterally No palpable cord bilaterally  Lymphadenopathy:    She has no cervical adenopathy.  Neurological: She is alert. GCS eye subscore is 4. GCS verbal subscore is 5. GCS motor subscore is 6.  Speech is clear and goal oriented Moves extremities without ataxia  Skin: Skin is warm and dry. Lesion noted. She is not diaphoretic.  Small 1x1cm lesion to the lateral left lower leg with scab in place and minimal surrounding erythema without induration or fluctuance.    Psychiatric: She has a normal mood and affect.    ED Course  Procedures (including critical care time) Labs Review Labs Reviewed  CBC - Abnormal; Notable for the following:    HCT 33.9 (*)    All other components within normal limits  COMPREHENSIVE METABOLIC PANEL - Abnormal; Notable for the following:    Potassium  3.2 (*)    BUN 5 (*)    Total Bilirubin 0.2 (*)    All other components within normal limits  URINALYSIS, ROUTINE W REFLEX MICROSCOPIC - Abnormal; Notable for the following:    APPearance CLOUDY (*)    All other components within normal limits  TROPONIN I  D-DIMER, QUANTITATIVE   Imaging Review No results found.  ECG:  Date: 06/30/2013  Rate: 97  Rhythm: normal sinus rhythm  QRS Axis: normal  Intervals: normal  ST/T Wave abnormalities: normal  Conduction Disutrbances:none  Narrative Interpretation: nonischemic ECG, unchanged from 06/16/10  Old EKG Reviewed: unchanged    MDM   1. CHEST PAIN UNSPECIFIED   2. Palpitations      AIJA SCARFO presents with c/o tachycardia and SOB.  Pt in NAD at this time without tachycardia or tachypnea. Pt is [redacted] weeks pregnant and under the care of [facility] as well as [facility] for her psychiatric care.    5:26 PM Pt given fluid 1 liter. Complete resolution of chest pain and SOB.  Patient ambulates without becoming short of breath. Patient symptoms likely secondary to her anxiety and/or her POTTS.  Chest pain is not likely of cardiac or pulmonary etiology d/t presentation,d-dimer negative, VSS, no tracheal deviation, no JVD or new murmur, RRR, breath sounds equal bilaterally, EKG without acute abnormalities, negative troponin.  She is without upper respiratory symptoms, cough or hypoxia. No x-ray obtained due to her pregnancy status and low likelihood for pneumonia.  Patient found to have hypokalemia at 3.2. Repletion begun in the emergency department. We'll discharge home with potassium supplement for 3 days and recommend followup with primary care and repeat blood work in one week.  Pt has been advised start pepcid and return to the ED if CP becomes exertional, associated with diaphoresis or nausea, radiates to left jaw/arm, worsens or becomes concerning in any way. Pt appears reliable for follow up and is agreeable to discharge. Patient is  to followup with Dr. Sherryl Manges of [facility] cardiology for further evaluation and treatment.  It has been determined that no acute conditions requiring further emergency intervention are present at  this time. The patient/guardian have been advised of the diagnosis and plan. We have discussed signs and symptoms that warrant return to the ED, such as changes or worsening in symptoms.   Vital signs are stable at discharge.   BP 122/75  Pulse 98  Temp(Src) 98.4 F (36.9 C) (Oral)  Resp 16  SpO2 98%  Patient/guardian has voiced understanding and agreed to follow-up with the PCP or specialist.      Dierdre Forth, PA-C 07/01/13 [phone]

## 2013-07-01 NOTE — ED Provider Notes (Signed)
Medical screening examination/treatment/procedure(s) were performed by non-physician practitioner and as supervising physician I was immediately available for consultation/collaboration.  Juliet Rude. Rubin Payor, MD 07/01/13 1431

## 2013-07-07 ENCOUNTER — Encounter: Payer: Self-pay | Admitting: Internal Medicine

## 2013-07-07 ENCOUNTER — Ambulatory Visit (INDEPENDENT_AMBULATORY_CARE_PROVIDER_SITE_OTHER): Payer: Self-pay | Admitting: Internal Medicine

## 2013-07-07 VITALS — BP 133/86 | HR 107 | Ht 67.0 in | Wt 156.0 lb

## 2013-07-07 DIAGNOSIS — O98919 Unspecified maternal infectious and parasitic disease complicating pregnancy, unspecified trimester: Secondary | ICD-10-CM

## 2013-07-07 DIAGNOSIS — G909 Disorder of the autonomic nervous system, unspecified: Secondary | ICD-10-CM

## 2013-07-07 DIAGNOSIS — R002 Palpitations: Secondary | ICD-10-CM

## 2013-07-07 DIAGNOSIS — F319 Bipolar disorder, unspecified: Secondary | ICD-10-CM

## 2013-07-07 DIAGNOSIS — G901 Familial dysautonomia [Riley-Day]: Secondary | ICD-10-CM

## 2013-07-07 DIAGNOSIS — R55 Syncope and collapse: Secondary | ICD-10-CM

## 2013-07-07 DIAGNOSIS — O98911 Unspecified maternal infectious and parasitic disease complicating pregnancy, first trimester: Secondary | ICD-10-CM

## 2013-07-07 NOTE — Assessment & Plan Note (Signed)
As above.

## 2013-07-07 NOTE — Assessment & Plan Note (Signed)
The patient has palpitations. She does carry the diagnosis of POTS. She has resting tachycardia without significant objective evidence of POTS at this time.  She does have recurrent syncope. These episodes are associated with the program about 1 minute and we had a lengthy discussion regarding importance of avoidance maneuvers i.e. lying down with the first onset of symptoms.  We have also discussed the interaction of pregnancy with autonomic dysfunction whereby the first trimester is particularly troublesome. The second trimester with volume retention is typically better in the third trimester to be challenging as the uterus begins to encroach upon the IVC.  Her course was further challenged by the efforts from her psychiatrist to modify her neuroleptics   It may well be aggravating her symptoms.  For now we will continue to push fluids. Intravenous fluids may be of help in the event that she is not able to take in significant fluids. I would like to avoid beta blockers

## 2013-07-07 NOTE — Progress Notes (Signed)
      Patient has no care team.   HPI  Carly Reyes is a 22 y.o. female Seen in followup for symptoms of dysautonomia consistent with POTS.  She is a history of recurrent syncope. These are typically associated with the prodrome of about a minute. They're associated with falls.  She also significant anxiety and bipolar disorder. Since she has become pregnant, there has been significant changes  her medication; she is now about [redacted] weeks pregnant. She struggled with some degree with nausea but this has gotten better.  She was seen recently in the emergency room for palpitations and shortness of breath in the context of significant nausea dehydration. Better with Hydration.  Past Medical History  Diagnosis Date  . Pott's disease     . Bipolar 1 disorder     Past Surgical History  Procedure Laterality Date  . Tonsillectomy      Current Outpatient Prescriptions  Medication Sig Dispense Refill  . calcium carbonate (TUMS - DOSED IN MG ELEMENTAL CALCIUM) 500 MG chewable tablet Chew 1 tablet by mouth 2 (two) times daily as needed for heartburn.      . clonazePAM (KLONOPIN) 0.5 MG tablet Take 0.5 mg by mouth 3 (three) times daily as needed for anxiety.      . famotidine (PEPCID) 20 MG tablet Take 1 tablet (20 mg total) by mouth 2 (two) times daily.  15 tablet  0  . lamoTRIgine (LAMICTAL) 200 MG tablet Take 200 mg by mouth daily.      . potassium chloride SA (K-DUR,KLOR-CON) 20 MEQ tablet Take 1 tablet (20 mEq total) by mouth daily.  3 tablet  0  . Prenatal Vit-Fe Fumarate-FA (PRENATAL MULTIVITAMIN) TABS tablet Take 1 tablet by mouth daily at 12 noon.      . promethazine (PHENERGAN) 25 MG tablet Take 25 mg by mouth every 6 (six) hours as needed for nausea.      Marland Kitchen QUEtiapine (SEROQUEL) 200 MG tablet Take 200 mg by mouth at bedtime.      . sertraline (ZOLOFT) 100 MG tablet Take 100 mg by mouth daily.       No current facility-administered medications for this visit.     Allergies  Allergen Reactions  . Lactose Intolerance (Gi) Nausea And Vomiting    Review of Systems negative except from HPI and PMH  Physical Exam BP 133/86  Pulse 107  Ht 5\' 7"  (1.702 m)  Wt 156 lb (70.761 kg)  BMI 24.43 kg/m2 Well developed and nourished in no acute distress HENT normal Neck supple with JVP-flat Clear Regular rate and rhythm, no murmurs or gallops Abd-soft with active BS No Clubbing cyanosis edema Skin-warm and dry A & Oriented  Grossly normal sensory and motor function  ECG from the hospital demonstrated normal sinus rhythm with mild repolarization abnormalities    Laboratories are notable for potassium of 3.2. Notably he did and 3.3 in March 2011   Assessment and  Plan

## 2013-07-07 NOTE — Patient Instructions (Signed)
Your physician recommends that you return for lab work today: BMET  Your physician wants you to follow-up in: 5/6 months with Dr. Graciela Husbands.  You will receive a reminder letter in the mail two months in advance. If you don't receive a letter, please call our office to schedule the follow-up appointment.

## 2013-07-08 LAB — BASIC METABOLIC PANEL
BUN: 9 mg/dL (ref 6–23)
CO2: 26 mEq/L (ref 19–32)
Calcium: 9.2 mg/dL (ref 8.4–10.5)
Chloride: 101 mEq/L (ref 96–112)
Creatinine, Ser: 0.6 mg/dL (ref 0.4–1.2)
GFR: 132.63 mL/min (ref >60.00)
Glucose, Bld: 86 mg/dL (ref 70–99)
Potassium: 3.8 mEq/L (ref 3.5–5.1)
Sodium: 136 mEq/L (ref 135–145)

## 2013-07-23 ENCOUNTER — Encounter: Payer: Self-pay | Admitting: Physician Assistant

## 2013-07-23 ENCOUNTER — Ambulatory Visit (INDEPENDENT_AMBULATORY_CARE_PROVIDER_SITE_OTHER): Payer: Self-pay | Admitting: Physician Assistant

## 2013-07-23 VITALS — BP 118/60 | HR 88 | Ht 67.0 in | Wt 157.0 lb

## 2013-07-23 DIAGNOSIS — R002 Palpitations: Secondary | ICD-10-CM

## 2013-07-23 DIAGNOSIS — R55 Syncope and collapse: Secondary | ICD-10-CM

## 2013-07-23 MED ORDER — NADOLOL 20 MG PO TABS: 20.0000 mg | ORAL_TABLET | Freq: Every day | ORAL | Status: DC

## 2013-07-23 NOTE — Progress Notes (Signed)
HPI: This is a 22 year old female patient of Dr. Berton Mount who has history of dysautonomia consistent with POTS. She is now [redacted] weeks pregnant and just saw Dr. Graciela Husbands approximately 2 weeks ago. She was having recurrent syncope and was in the emergency room for palpitations and shortness of breath. He felt her pregnancy probably aggravated her symptoms and recommended pushing fluids. At that time he wanted to avoid beta blockers if possible.  The patient is here today because she is coming off her anxiolytic medicines because of her pregnancy. She says both her OB/GYN and psychiatrists have recommended beta blocker. She says her heart rate in the morning is about 90 but if she gets up to do anything it comes up to 120 R. 130 beats per minute. It takes an hour to to come down. She feels very anxious and drained with this. She is taking nadolol in the past. She denies any further syncope since she was last here. She is pushing fluids.  Allergies -- Lactose Intolerance (Gi) -- Nausea And Vomiting  Current Outpatient Prescriptions on File Prior to Visit: calcium carbonate (TUMS - DOSED IN MG ELEMENTAL CALCIUM) 500 MG chewable tablet, Chew 1 tablet by mouth 2 (two) times daily as needed for heartburn., Disp: , Rfl:  clonazePAM (KLONOPIN) 0.5 MG tablet, Take 0.5 mg by mouth daily. , Disp: , Rfl:  famotidine (PEPCID) 20 MG tablet, Take 1 tablet (20 mg total) by mouth 2 (two) times daily., Disp: 15 tablet, Rfl: 0 lamoTRIgine (LAMICTAL) 200 MG tablet, Take 200 mg by mouth daily., Disp: , Rfl:  potassium chloride SA (K-DUR,KLOR-CON) 20 MEQ tablet, Take 1 tablet (20 mEq total) by mouth daily., Disp: 3 tablet, Rfl: 0 Prenatal Vit-Fe Fumarate-FA (PRENATAL MULTIVITAMIN) TABS tablet, Take 1 tablet by mouth daily at 12 noon., Disp: , Rfl:  promethazine (PHENERGAN) 25 MG tablet, Take 25 mg by mouth every 6 (six) hours as needed for nausea., Disp: , Rfl:  QUEtiapine (SEROQUEL) 200 MG tablet, Take 200 mg by mouth at  bedtime., Disp: , Rfl:  sertraline (ZOLOFT) 100 MG tablet, Take 100 mg by mouth daily., Disp: , Rfl:   No current facility-administered medications on file prior to visit.   Past Medical History:   Pott's disease                                               Bipolar 1 disorder                                          Past Surgical History:   TONSILLECTOMY                                                Review of patient's family history indicates:   Hypertension                   Mother                   Hypertension                   Father  Heart failure                  Father                   Thyroid disease                Father                   Graves' disease                Father                   Social History   Marital Status: Single              Spouse Name:                      Years of Education:                 Number of children:             Occupational History   None on file  Social History Main Topics   Smoking Status: Former Smoker                   Packs/Day: 0.00  Years:         Smokeless Status: Never Used                       Alcohol Use: No             Drug Use: Yes               Special: Marijuana      Comment: prior to pregnancy   Sexual Activity: Yes                Other Topics            Concern   None on file  Social History Narrative   None on file    ROS: See history of present illness otherwise negative   PHYSICAL EXAM: Well-nournished, in no acute distress. Neck: No JVD, HJR, Bruit, or thyroid enlargement  Lungs: No tachypnea, clear without wheezing, rales, or rhonchi  Cardiovascular: RRR, PMI not displaced, heart sounds normal, no murmurs, gallops, bruit, thrill, or heave.  Abdomen: BS normal. Soft without organomegaly, masses, lesions or tenderness.  Extremities: without cyanosis, clubbing or edema. Good distal pulses bilateral  SKin: Warm, no lesions or rashes    Musculoskeletal: No  deformities  Neuro: no focal signs  BP 118/60  Pulse 88  Ht 5\' 7"  (1.702 m)  Wt 157 lb (71.215 kg)  BMI 24.58 kg/m2   EKG: Normal Sinus rhythm at 86 beats per minute

## 2013-07-23 NOTE — Assessment & Plan Note (Signed)
No syncope since last office visit

## 2013-07-23 NOTE — Patient Instructions (Signed)
Your physician recommends that you schedule a follow-up appointment in: FOLLOW UP WITH DR. Graciela Husbands AS DIRECTED  Your physician has recommended you make the following change in your medication:   START NADOLOL 20 MG ONCE A DAY  Your physician recommends that you continue on your current medications as directed. Please refer to the Current Medication list given to you today.

## 2013-07-23 NOTE — Assessment & Plan Note (Signed)
Patient has palpitations with very little activity. Her heart rate jumps up 220 and 130 beats per minute with little exertion. Takes about an hour or two to stabilize. I discussed this patient in detail with Dr. Graciela Husbands who agrees we can try nadolol 20 mg once daily. She is to call she has any recurrent syncope or problems with this medication. Followup with Dr. Graciela Husbands

## 2013-07-23 NOTE — Addendum Note (Signed)
Addended by: Guillermina City A on: 07/23/2013 01:37 PM   Modules accepted: Orders

## 2013-09-05 ENCOUNTER — Other Ambulatory Visit (HOSPITAL_COMMUNITY): Payer: Self-pay | Admitting: Obstetrics & Gynecology

## 2013-09-05 DIAGNOSIS — IMO0002 Reserved for concepts with insufficient information to code with codable children: Secondary | ICD-10-CM

## 2013-09-05 DIAGNOSIS — Z0489 Encounter for examination and observation for other specified reasons: Secondary | ICD-10-CM

## 2013-09-25 NOTE — L&D Delivery Note (Signed)
Delivery Note At 8:46 PM a viable female was delivered via Vaginal, Spontaneous Delivery (Presentation: Middle Occiput Anterior).  APGAR: 8, 8; weight 5#12oz.   Placenta status: , Pathology Spontaneous.  Cord: 3 vessels with the following complications: None.  Hospital Course:  23yo G1P0 @ [redacted]w[redacted]d who presented for IOL due to postdates.  Induction was started with cytotec and continued with Pitocin per protocol.  She received an epidural for pain.  Due to Cat. II tracing, upon AROM of thick meconium, internal monitors (IUPC & FSE) were placed.  Amnioinfusion was started due to occasional variable decels.  The patient progressed to complete, NSVD without complications.  Right labial tear was repaired with 3-0 chromic and the lateral side wall tears were repaired with 2-0 and 3-0 vicryl in a running fashion.     Anesthesia: Epidural  Episiotomy: none Lacerations: right labia and bilateral side wall tears Suture Repair: 2.0 3.0 vicryl and 3-0 chromic (right labial) Est. Blood Loss (mL): 400cc  Mom to postpartum.  Baby to Couplet care / Skin to Skin.  Sharon Seller 02/17/2014, 9:47 PM

## 2013-09-26 ENCOUNTER — Ambulatory Visit (HOSPITAL_COMMUNITY): Payer: Self-pay | Attending: Obstetrics & Gynecology

## 2013-10-14 ENCOUNTER — Ambulatory Visit (HOSPITAL_COMMUNITY): Payer: Self-pay

## 2013-11-12 ENCOUNTER — Other Ambulatory Visit: Payer: Self-pay | Admitting: *Deleted

## 2013-11-12 MED ORDER — NADOLOL 20 MG PO TABS: 20.0000 mg | ORAL_TABLET | Freq: Every day | ORAL | Status: DC

## 2014-01-16 LAB — OB RESULTS CONSOLE GBS: GBS: NEGATIVE

## 2014-01-20 ENCOUNTER — Ambulatory Visit (INDEPENDENT_AMBULATORY_CARE_PROVIDER_SITE_OTHER): Payer: Medicaid Other | Admitting: Internal Medicine

## 2014-01-20 ENCOUNTER — Encounter: Payer: Self-pay | Admitting: Internal Medicine

## 2014-01-20 VITALS — BP 120/75 | HR 92 | Ht 67.0 in | Wt 184.0 lb

## 2014-01-20 DIAGNOSIS — G909 Disorder of the autonomic nervous system, unspecified: Secondary | ICD-10-CM

## 2014-01-20 DIAGNOSIS — R002 Palpitations: Secondary | ICD-10-CM

## 2014-01-20 DIAGNOSIS — G901 Familial dysautonomia [Riley-Day]: Secondary | ICD-10-CM

## 2014-01-20 MED ORDER — NADOLOL 20 MG PO TABS: 20.0000 mg | ORAL_TABLET | Freq: Every day | ORAL | Status: DC

## 2014-01-20 NOTE — Patient Instructions (Signed)
Your physician recommends that you continue on your current medications as directed. Please refer to the Current Medication list given to you today.  Your physician recommends that you schedule a follow-up appointment in: 3 months with Dr. Klein.  

## 2014-01-20 NOTE — Progress Notes (Signed)
      Patient has no care team.   HPI  Carly HartsStephanie B Reyes is a 23 y.o. female Seen in followup for symptoms most consistent with POTS. She also has anxiety and bipolar disorder.  She is now 9 months pregnant. She is doing just a few weeks. She is tolerating things amazingly well; she has had some a.m. Tachypalpitations.  These are largely mitigated by her small dose of nadolol of his heart   Past Medical History  Diagnosis Date  . Pott's disease     . Bipolar 1 disorder     Past Surgical History  Procedure Laterality Date  . Tonsillectomy      Current Outpatient Prescriptions  Medication Sig Dispense Refill  . famotidine (PEPCID) 20 MG tablet Take 1 tablet (20 mg total) by mouth 2 (two) times daily.  15 tablet  0  . lamoTRIgine (LAMICTAL) 200 MG tablet Take 200 mg by mouth daily.      . nadolol (CORGARD) 20 MG tablet Take 1 tablet (20 mg total) by mouth daily.  30 tablet  3  . Prenatal Vit-Fe Fumarate-FA (PRENATAL MULTIVITAMIN) TABS tablet Take 1 tablet by mouth daily at 12 noon.      . QUEtiapine (SEROQUEL) 300 MG tablet Take 300 mg by mouth at bedtime.      . sertraline (ZOLOFT) 100 MG tablet Take 100 mg by mouth daily.       No current facility-administered medications for this visit.    Allergies  Allergen Reactions  . Lactose Intolerance (Gi) Nausea And Vomiting    Review of Systems negative except from HPI and PMH  Physical Exam BP 120/75  Pulse 92  Ht 5\' 7"  (1.702 m)  Wt 184 lb (83.462 kg)  BMI 28.81 kg/m2 Well developed and well nourished in no acute distress HENT normal E scleral and icterus clear Neck Supple JVP flat; carotids brisk and full Clear to ausculation  Regular rate and rhythm, no murmurs gallops or rub Soft with active bowel sounds No clubbing cyanosis tr  Edema Alert and oriented, grossly normal motor and sensory function Skin Warm and Dry    Assessment and  Plan  POTS  Bipolar/anxiety  Pregnancy  She is doing quite  well. I've advised her of the importance of adequate volume repletion at the time of her delivery. We have also discussed issues related to ambient heat during the summer. See her in July

## 2014-01-30 ENCOUNTER — Inpatient Hospital Stay (HOSPITAL_COMMUNITY): Payer: Medicaid Other

## 2014-01-30 ENCOUNTER — Inpatient Hospital Stay (HOSPITAL_COMMUNITY)
Admission: AD | Admit: 2014-01-30 | Discharge: 2014-01-30 | Disposition: A | Discharge status: Home / Self Care | Payer: Medicaid Other | Source: Ambulatory Visit | Attending: Obstetrics & Gynecology | Admitting: Obstetrics & Gynecology

## 2014-01-30 ENCOUNTER — Encounter (HOSPITAL_COMMUNITY): Payer: Self-pay | Admitting: *Deleted

## 2014-01-30 DIAGNOSIS — O368390 Maternal care for abnormalities of the fetal heart rate or rhythm, unspecified trimester, not applicable or unspecified: Secondary | ICD-10-CM | POA: Insufficient documentation

## 2014-01-30 NOTE — MAU Note (Signed)
Dr. Charlotta Newtonzan informed of BPP 8/8 results and Reactive NST in MAU. Discharge orders received.

## 2014-01-30 NOTE — MAU Note (Signed)
Patient presents to MAU having been sent over from the office for a non-reactive NST. Denies any pain, VB, LOF, or contractions at this time. Reports good fetal movement.

## 2014-01-30 NOTE — Discharge Instructions (Signed)
Fetal Biophysical Profile °This is a test that measures five different variables of the fetus: Heart rate, breathing movement, total movement of the baby, fetal muscle tone, the amount of amniotic fluid, and the heart rate activity of the fetus. The five variables are measured individually and contribute either a 2 or a 0 to the overall scoring of the test. The measurements are as follows: °· Fetal heart rate activity. This is measured and scored in the same way as a non-stress test. The fetal heart rate is considered reactive when there are movement-associated fetal heart rate increases of at least 15 beats per minute above baseline, and 15 seconds in duration over a 20-minute period. A score of 2 is given for reactivity, and a score of 0 indicates that the fetal heart rate is non-reactive. °· Fetal breathing movements. This is scored based on fetal breathing movements and indicate fetal well-being. Their absence may indicate a low oxygen level for the fetus. Fetal breathing increases in frequency and uniformity after the 36th week of pregnancy. To earn a score of 2, the fetus must have at least one episode of fetal breathing lasting at least 60 seconds within a 30-minute observation. Absence of this breathing is scored a 0 on the BPP. °· Fetal body movements. Fetal activity is a reflection of brain integrity and function. The presence of at least three episodes of fetal movements within a 30-minute period is given a score of 2. A score of 0 is given with two or less movements in this time period. Fetal activity is highest 1 to 3 hours after the mother has eaten a meal. °· Fetal tone. In the uterus, the fetus is normally in a position of flexion. This means the head is bent down towards the knees. The fetus also stretches, rolls, and moves in the uterus. The arms, legs, trunk, and head may be flexed and extended. A score of 2 is earned when there is at least one episode of active extension with return flexion. A  score of 0 is given for slow extension with a return to only partial flexion. Fetal movement not followed by return to flexion, limbs or spine in extension, and an open fetal hand score 0. °· Amniotic fluid volume. Amniotic fluid volume has been demonstrated to be a good method of predicting fetal distress. Too little amniotic fluid has been associated with fetal abnormalities, slow uterine growth, and over due pregnancy. A score of 2 is given for this when there is at least one pocket of amniotic fluid that measures 1 cm in a specific area. A score of 0 indicates either that fluid is absent in most areas of the uterine cavity or that the largest pocket of fluid measures less than 1 cm. °PREPARATION FOR TEST °No preparation or fasting is necessary. °NORMAL FINDINGS °· A score of 8-10 points (if amniotic fluid volume is adequate). °· Possible critical values: Less than 4 may necessitate immediate delivery of fetus. °Ranges for normal findings may vary among different laboratories and hospitals. You should always check with your doctor after having lab work or other tests done to discuss the meaning of your test results and whether your values are considered within normal limits. °MEANING OF TEST  °Your caregiver will go over the test results with you and discuss the importance and meaning of your results, as well as treatment options and the need for additional tests if necessary. °OBTAINING THE TEST RESULTS  °It is your responsibility to obtain your test   results. Ask the lab or department performing the test when and how you will get your results. Document Released: 01/12/2005 Document Revised: 12/04/2011 Document Reviewed: 08/21/2008 [facility] LPExit[facility] Patient Information 2014 [place], [place]. Fetal Movement Counts Patient Name: __________________________________________________ Patient Due Date: ____________________ Performing a fetal movement count is highly recommended in high-risk pregnancies, but it is good for  every pregnant woman to do. Your caregiver may ask you to start counting fetal movements at 28 weeks of the pregnancy. Fetal movements often increase:  After eating a full meal.  After physical activity.  After eating or drinking something sweet or cold.  At rest. Pay attention to when you feel the baby is most active. This will help you notice a pattern of your baby's sleep and wake cycles and what factors contribute to an increase in fetal movement. It is important to perform a fetal movement count at the same time each day when your baby is normally most active.  HOW TO COUNT FETAL MOVEMENTS 1. Find a quiet and comfortable area to sit or lie down on your left side. Lying on your left side provides the best blood and oxygen circulation to your baby. 2. Write down the day and time on a sheet of paper or in a journal. 3. Start counting kicks, flutters, swishes, rolls, or jabs in a 2 hour period. You should feel at least 10 movements within 2 hours. 4. If you do not feel 10 movements in 2 hours, wait 2 3 hours and count again. Look for a change in the pattern or not enough counts in 2 hours. SEEK MEDICAL CARE IF:  You feel less than 10 counts in 2 hours, tried twice.  There is no movement in over an hour.  The pattern is changing or taking longer each day to reach 10 counts in 2 hours.  You feel the baby is not moving as he or she usually does. Date: ____________ Movements: ____________ Start time: ____________ Doreatha MartinFinish time: ____________  Date: ____________ Movements: ____________ Start time: ____________ Doreatha MartinFinish time: ____________ Date: ____________ Movements: ____________ Start time: ____________ Doreatha MartinFinish time: ____________ Date: ____________ Movements: ____________ Start time: ____________ Doreatha MartinFinish time: ____________ Date: ____________ Movements: ____________ Start time: ____________ Doreatha MartinFinish time: ____________ Date: ____________ Movements: ____________ Start time: ____________ Doreatha MartinFinish time:  ____________ Date: ____________ Movements: ____________ Start time: ____________ Doreatha MartinFinish time: ____________ Date: ____________ Movements: ____________ Start time: ____________ Doreatha MartinFinish time: ____________  Date: ____________ Movements: ____________ Start time: ____________ Doreatha MartinFinish time: ____________ Date: ____________ Movements: ____________ Start time: ____________ Doreatha MartinFinish time: ____________ Date: ____________ Movements: ____________ Start time: ____________ Doreatha MartinFinish time: ____________ Date: ____________ Movements: ____________ Start time: ____________ Doreatha MartinFinish time: ____________ Date: ____________ Movements: ____________ Start time: ____________ Doreatha MartinFinish time: ____________ Date: ____________ Movements: ____________ Start time: ____________ Doreatha MartinFinish time: ____________ Date: ____________ Movements: ____________ Start time: ____________ Doreatha MartinFinish time: ____________  Date: ____________ Movements: ____________ Start time: ____________ Doreatha MartinFinish time: ____________ Date: ____________ Movements: ____________ Start time: ____________ Doreatha MartinFinish time: ____________ Date: ____________ Movements: ____________ Start time: ____________ Doreatha MartinFinish time: ____________ Date: ____________ Movements: ____________ Start time: ____________ Doreatha MartinFinish time: ____________ Date: ____________ Movements: ____________ Start time: ____________ Doreatha MartinFinish time: ____________ Date: ____________ Movements: ____________ Start time: ____________ Doreatha MartinFinish time: ____________ Date: ____________ Movements: ____________ Start time: ____________ Doreatha MartinFinish time: ____________  Date: ____________ Movements: ____________ Start time: ____________ Doreatha MartinFinish time: ____________ Date: ____________ Movements: ____________ Start time: ____________ Doreatha MartinFinish time: ____________ Date: ____________ Movements: ____________ Start time: ____________ Doreatha MartinFinish time: ____________ Date: ____________ Movements: ____________ Start time: ____________ Doreatha MartinFinish time: ____________ Date: ____________ Movements:  ____________ Start time:  ____________ Doreatha MartinFinish time: ____________ Date: ____________ Movements: ____________ Start time: ____________ Doreatha MartinFinish time: ____________ Date: ____________ Movements: ____________ Start time: ____________ Doreatha MartinFinish time: ____________  Date: ____________ Movements: ____________ Start time: ____________ Doreatha MartinFinish time: ____________ Date: ____________ Movements: ____________ Start time: ____________ Doreatha MartinFinish time: ____________ Date: ____________ Movements: ____________ Start time: ____________ Doreatha MartinFinish time: ____________ Date: ____________ Movements: ____________ Start time: ____________ Doreatha MartinFinish time: ____________ Date: ____________ Movements: ____________ Start time: ____________ Doreatha MartinFinish time: ____________ Date: ____________ Movements: ____________ Start time: ____________ Doreatha MartinFinish time: ____________ Date: ____________ Movements: ____________ Start time: ____________ Doreatha MartinFinish time: ____________  Date: ____________ Movements: ____________ Start time: ____________ Doreatha MartinFinish time: ____________ Date: ____________ Movements: ____________ Start time: ____________ Doreatha MartinFinish time: ____________ Date: ____________ Movements: ____________ Start time: ____________ Doreatha MartinFinish time: ____________ Date: ____________ Movements: ____________ Start time: ____________ Doreatha MartinFinish time: ____________ Date: ____________ Movements: ____________ Start time: ____________ Doreatha MartinFinish time: ____________ Date: ____________ Movements: ____________ Start time: ____________ Doreatha MartinFinish time: ____________ Date: ____________ Movements: ____________ Start time: ____________ Doreatha MartinFinish time: ____________  Date: ____________ Movements: ____________ Start time: ____________ Doreatha MartinFinish time: ____________ Date: ____________ Movements: ____________ Start time: ____________ Doreatha MartinFinish time: ____________ Date: ____________ Movements: ____________ Start time: ____________ Doreatha MartinFinish time: ____________ Date: ____________ Movements: ____________ Start time: ____________ Doreatha MartinFinish  time: ____________ Date: ____________ Movements: ____________ Start time: ____________ Doreatha MartinFinish time: ____________ Date: ____________ Movements: ____________ Start time: ____________ Doreatha MartinFinish time: ____________ Date: ____________ Movements: ____________ Start time: ____________ Doreatha MartinFinish time: ____________  Date: ____________ Movements: ____________ Start time: ____________ Doreatha MartinFinish time: ____________ Date: ____________ Movements: ____________ Start time: ____________ Doreatha MartinFinish time: ____________ Date: ____________ Movements: ____________ Start time: ____________ Doreatha MartinFinish time: ____________ Date: ____________ Movements: ____________ Start time: ____________ Doreatha MartinFinish time: ____________ Date: ____________ Movements: ____________ Start time: ____________ Doreatha MartinFinish time: ____________ Date: ____________ Movements: ____________ Start time: ____________ Doreatha MartinFinish time: ____________ Document Released: 10/11/2006 Document Revised: 08/28/2012 Document Reviewed: 07/08/2012 [employer] Patient Information 2014 [place], [place].

## 2014-02-16 ENCOUNTER — Inpatient Hospital Stay (HOSPITAL_COMMUNITY)
Admission: AD | Admit: 2014-02-16 | Discharge: 2014-02-19 | DRG: 775 | Disposition: A | Discharge status: Home / Self Care | Payer: Medicaid Other | Source: Ambulatory Visit | Attending: Obstetrics & Gynecology | Admitting: Obstetrics & Gynecology

## 2014-02-16 ENCOUNTER — Encounter (HOSPITAL_COMMUNITY): Payer: Self-pay | Admitting: *Deleted

## 2014-02-16 DIAGNOSIS — F411 Generalized anxiety disorder: Secondary | ICD-10-CM | POA: Diagnosis present

## 2014-02-16 DIAGNOSIS — O480 Post-term pregnancy: Principal | ICD-10-CM | POA: Diagnosis present

## 2014-02-16 DIAGNOSIS — Z3490 Encounter for supervision of normal pregnancy, unspecified, unspecified trimester: Secondary | ICD-10-CM

## 2014-02-16 DIAGNOSIS — O411090 Infection of amniotic sac and membranes, unspecified, unspecified trimester, not applicable or unspecified: Secondary | ICD-10-CM | POA: Diagnosis present

## 2014-02-16 DIAGNOSIS — O99344 Other mental disorders complicating childbirth: Secondary | ICD-10-CM | POA: Diagnosis present

## 2014-02-16 HISTORY — DX: Major depressive disorder, single episode, unspecified: F32.9

## 2014-02-16 HISTORY — DX: Depression, unspecified: F32.A

## 2014-02-16 LAB — CBC
HCT: 35.3 % — ABNORMAL LOW (ref 36.0–46.0)
Hemoglobin: 12.5 g/dL (ref 12.0–15.0)
MCH: 31.3 pg (ref 26.0–34.0)
MCHC: 35.4 g/dL (ref 30.0–36.0)
MCV: 88.3 fL (ref 78.0–100.0)
Platelets: 250 10*3/uL (ref 150–400)
RBC: 4 MIL/uL (ref 3.87–5.11)
RDW: 12.9 % (ref 11.5–15.5)
WBC: 12.6 10*3/uL — ABNORMAL HIGH (ref 4.0–10.5)

## 2014-02-16 LAB — RAPID STREP SCREEN (MED CTR MEBANE ONLY): Streptococcus, Group A Screen (Direct): NEGATIVE

## 2014-02-16 MED ORDER — PROMETHAZINE HCL 25 MG/ML IJ SOLN: 25.0000 mg | Freq: Once | INTRAMUSCULAR | Status: DC

## 2014-02-16 MED ORDER — CITRIC ACID-SODIUM CITRATE 334-500 MG/5ML PO SOLN: 30.0000 mL | ORAL | Status: DC | PRN

## 2014-02-16 MED ORDER — SERTRALINE HCL 100 MG PO TABS
100.0000 mg | ORAL_TABLET | Freq: Every day | ORAL | Status: DC
  Administered 2014-02-17: 100 mg via ORAL
  Filled 2014-02-16 (×2): qty 1

## 2014-02-16 MED ORDER — LIDOCAINE HCL (PF) 1 % IJ SOLN
30.0000 mL | INTRAMUSCULAR | Status: DC | PRN
  Administered 2014-02-17: 30 mL via SUBCUTANEOUS
  Filled 2014-02-16: qty 30

## 2014-02-16 MED ORDER — LAMOTRIGINE 200 MG PO TABS
200.0000 mg | ORAL_TABLET | Freq: Every day | ORAL | Status: DC
  Administered 2014-02-17: 200 mg via ORAL
  Filled 2014-02-16 (×2): qty 1

## 2014-02-16 MED ORDER — ONDANSETRON HCL 4 MG/2ML IJ SOLN: 4.0000 mg | Freq: Four times a day (QID) | INTRAMUSCULAR | Status: DC | PRN

## 2014-02-16 MED ORDER — OXYTOCIN 40 UNITS IN LACTATED RINGERS INFUSION - SIMPLE MED
62.5000 mL/h | INTRAVENOUS | Status: DC
  Administered 2014-02-17: 62.5 mL/h via INTRAVENOUS

## 2014-02-16 MED ORDER — NALBUPHINE HCL 10 MG/ML IJ SOLN: 10.0000 mg | INTRAMUSCULAR | Status: DC | PRN

## 2014-02-16 MED ORDER — TERBUTALINE SULFATE 1 MG/ML IJ SOLN: 0.2500 mg | Freq: Once | INTRAMUSCULAR | Status: AC | PRN

## 2014-02-16 MED ORDER — NADOLOL 20 MG PO TABS
20.0000 mg | ORAL_TABLET | Freq: Every day | ORAL | Status: DC
  Administered 2014-02-16: 20 mg via ORAL
  Filled 2014-02-16 (×2): qty 1

## 2014-02-16 MED ORDER — OXYCODONE-ACETAMINOPHEN 5-325 MG PO TABS: 1.0000 | ORAL_TABLET | ORAL | Status: DC | PRN

## 2014-02-16 MED ORDER — IBUPROFEN 600 MG PO TABS: 600.0000 mg | ORAL_TABLET | Freq: Four times a day (QID) | ORAL | Status: DC | PRN

## 2014-02-16 MED ORDER — LACTATED RINGERS IV SOLN
INTRAVENOUS | Status: DC
  Administered 2014-02-16 – 2014-02-17 (×4): via INTRAVENOUS

## 2014-02-16 MED ORDER — QUETIAPINE FUMARATE ER 300 MG PO TB24
300.0000 mg | ORAL_TABLET | Freq: Every day | ORAL | Status: DC
  Administered 2014-02-16: 300 mg via ORAL
  Filled 2014-02-16 (×2): qty 1

## 2014-02-16 MED ORDER — LACTATED RINGERS IV SOLN
INTRAVENOUS | Status: DC
  Administered 2014-02-16 – 2014-02-17 (×2): via INTRAVENOUS

## 2014-02-16 MED ORDER — OXYTOCIN BOLUS FROM INFUSION: 500.0000 mL | INTRAVENOUS | Status: DC

## 2014-02-16 MED ORDER — MISOPROSTOL 25 MCG QUARTER TABLET
25.0000 ug | ORAL_TABLET | ORAL | Status: DC | PRN
Start: 2014-02-16 — End: 2014-02-17
  Administered 2014-02-17: 25 ug via VAGINAL
  Filled 2014-02-16: qty 0.25
  Filled 2014-02-16: qty 1

## 2014-02-16 MED ORDER — LACTATED RINGERS IV SOLN
500.0000 mL | INTRAVENOUS | Status: DC | PRN
  Administered 2014-02-17 (×2): 500 mL via INTRAVENOUS

## 2014-02-16 MED ORDER — ACETAMINOPHEN 325 MG PO TABS
650.0000 mg | ORAL_TABLET | ORAL | Status: DC | PRN
  Administered 2014-02-16: 650 mg via ORAL
  Filled 2014-02-16: qty 2

## 2014-02-16 NOTE — H&P (Signed)
HPI: 23 y/o G1P0 @ [redacted]w[redacted]d estimated gestational age who presents for postdates IOL.  no Leaking of Fluid,   no Vaginal Bleeding,   no Uterine Contractions,  + Fetal Movement.  ROS: no HA, no epigastric pain, no visual changes.    Pregnancy complicated by: -Bipolar disease/anxiety disorder:   current medications include: Seroquel XR 300mg  daily, zoloft 100mg  daily, Lamictal 200mg  daily.    Pt followed by Vesta Mixer, mood appropriate and stable -POTS  Followed by cardiology, current meds: Natelol 20mg  daily   Prenatal Transfer Tool  Maternal Diabetes: No Genetic Screening: Normal Maternal Ultrasounds/Referrals: Normal Fetal Ultrasounds or other Referrals:  None Maternal Substance Abuse:  No Significant Maternal Medications:  Meds include: Zoloft, Lamictal, Seroquel Significant Maternal Lab Results: None   PNL:  GBS negative, Rub Immune, Hep B neg, RPR NR, HIV neg, GC/C neg, glucola:normal Blood type: B positive, antibody negative  OBHx: primip PMHx:  Bipolar disorder, Anxiety, POTS Meds:  PNV, lamictal, zoloft, seroquel, natelol Allergy:  No Known Allergies SurgHx: nond SocHx:   no Tobacco, no  EtOH, no Illicit Drugs  O: BP 126/65  Pulse 96  Temp(Src) 98.6 F (37 C) (Oral)  Ht 5\' 7"  (1.702 m)  Wt 84.823 kg (187 lb)  BMI 29.28 kg/m2  FHT: 140 baseline, moderate variability, + accels,  Occasional variable decel Toco: irregular SVE: 1/50/-3 (per Haroldine Laws)   Labs: see orders  A/P:  23 y.o. G1P0 @ [redacted]w[redacted]d EGA who presents for induction of labor for postdates -FWB:  NICHD Cat II FHTs, overall FHT reassuring -Labor: will plan for induction with cytotec -GBS: negative -Bipolar d/o, anxiety: continue on daily medications -POTS: continue Natelol daily  Myna Hidalgo, DO [phone] (pager) [phone] (office)

## 2014-02-17 ENCOUNTER — Inpatient Hospital Stay (HOSPITAL_COMMUNITY): Payer: Medicaid Other | Admitting: Anesthesiology

## 2014-02-17 ENCOUNTER — Encounter (HOSPITAL_COMMUNITY): Payer: Self-pay | Admitting: *Deleted

## 2014-02-17 ENCOUNTER — Encounter (HOSPITAL_COMMUNITY): Payer: Medicaid Other | Admitting: Anesthesiology

## 2014-02-17 LAB — RPR

## 2014-02-17 MED ORDER — SERTRALINE HCL 100 MG PO TABS
100.0000 mg | ORAL_TABLET | Freq: Every day | ORAL | Status: DC
  Administered 2014-02-18 – 2014-02-19 (×2): 100 mg via ORAL
  Filled 2014-02-17 (×2): qty 1

## 2014-02-17 MED ORDER — QUETIAPINE FUMARATE ER 300 MG PO TB24
300.0000 mg | ORAL_TABLET | Freq: Every day | ORAL | Status: DC
  Administered 2014-02-18 (×2): 300 mg via ORAL
  Filled 2014-02-17 (×3): qty 1

## 2014-02-17 MED ORDER — LANOLIN HYDROUS EX OINT: TOPICAL_OINTMENT | CUTANEOUS | Status: DC | PRN

## 2014-02-17 MED ORDER — OXYTOCIN 40 UNITS IN LACTATED RINGERS INFUSION - SIMPLE MED
1.0000 m[IU]/min | INTRAVENOUS | Status: DC
  Filled 2014-02-17: qty 1000

## 2014-02-17 MED ORDER — OXYTOCIN 40 UNITS IN LACTATED RINGERS INFUSION - SIMPLE MED
1.0000 m[IU]/min | INTRAVENOUS | Status: DC
  Administered 2014-02-17: 1 m[IU]/min via INTRAVENOUS

## 2014-02-17 MED ORDER — ZOLPIDEM TARTRATE 5 MG PO TABS: 5.0000 mg | ORAL_TABLET | Freq: Every evening | ORAL | Status: DC | PRN

## 2014-02-17 MED ORDER — EPHEDRINE 5 MG/ML INJ
10.0000 mg | INTRAVENOUS | Status: DC | PRN
  Filled 2014-02-17: qty 2

## 2014-02-17 MED ORDER — FENTANYL 2.5 MCG/ML BUPIVACAINE 1/10 % EPIDURAL INFUSION (WH - ANES)
INTRAMUSCULAR | Status: DC | PRN
  Administered 2014-02-17: 14 mL/h via EPIDURAL

## 2014-02-17 MED ORDER — LIDOCAINE HCL (PF) 1 % IJ SOLN
INTRAMUSCULAR | Status: DC | PRN
  Administered 2014-02-17 (×2): 8 mL

## 2014-02-17 MED ORDER — LACTATED RINGERS IV SOLN
500.0000 mL | Freq: Once | INTRAVENOUS | Status: AC
  Administered 2014-02-17: 500 mL via INTRAVENOUS

## 2014-02-17 MED ORDER — PHENYLEPHRINE 40 MCG/ML (10ML) SYRINGE FOR IV PUSH (FOR BLOOD PRESSURE SUPPORT)
PREFILLED_SYRINGE | INTRAVENOUS | Status: AC
  Filled 2014-02-17: qty 10

## 2014-02-17 MED ORDER — OXYCODONE-ACETAMINOPHEN 5-325 MG PO TABS: 1.0000 | ORAL_TABLET | ORAL | Status: DC | PRN

## 2014-02-17 MED ORDER — PRENATAL MULTIVITAMIN CH
1.0000 | ORAL_TABLET | Freq: Every day | ORAL | Status: DC
  Administered 2014-02-18 – 2014-02-19 (×2): 1 via ORAL
  Filled 2014-02-17 (×2): qty 1

## 2014-02-17 MED ORDER — PHENYLEPHRINE 40 MCG/ML (10ML) SYRINGE FOR IV PUSH (FOR BLOOD PRESSURE SUPPORT)
80.0000 ug | PREFILLED_SYRINGE | INTRAVENOUS | Status: DC | PRN
  Filled 2014-02-17: qty 2

## 2014-02-17 MED ORDER — SENNOSIDES-DOCUSATE SODIUM 8.6-50 MG PO TABS
2.0000 | ORAL_TABLET | ORAL | Status: DC
  Administered 2014-02-18 – 2014-02-19 (×2): 2 via ORAL
  Filled 2014-02-17 (×2): qty 2

## 2014-02-17 MED ORDER — NADOLOL 20 MG PO TABS
20.0000 mg | ORAL_TABLET | Freq: Every day | ORAL | Status: DC
  Administered 2014-02-18 (×2): 20 mg via ORAL
  Filled 2014-02-17 (×2): qty 1

## 2014-02-17 MED ORDER — DIBUCAINE 1 % RE OINT
1.0000 "application " | TOPICAL_OINTMENT | RECTAL | Status: DC | PRN
  Administered 2014-02-18: 1 via RECTAL
  Filled 2014-02-17: qty 28

## 2014-02-17 MED ORDER — ONDANSETRON HCL 4 MG PO TABS: 4.0000 mg | ORAL_TABLET | ORAL | Status: DC | PRN

## 2014-02-17 MED ORDER — LAMOTRIGINE 200 MG PO TABS
200.0000 mg | ORAL_TABLET | Freq: Every day | ORAL | Status: DC
  Administered 2014-02-18 – 2014-02-19 (×2): 200 mg via ORAL
  Filled 2014-02-17 (×2): qty 1

## 2014-02-17 MED ORDER — LORATADINE 10 MG PO TABS
10.0000 mg | ORAL_TABLET | Freq: Every day | ORAL | Status: DC
  Administered 2014-02-19: 10 mg via ORAL
  Filled 2014-02-17 (×2): qty 1

## 2014-02-17 MED ORDER — FENTANYL 2.5 MCG/ML BUPIVACAINE 1/10 % EPIDURAL INFUSION (WH - ANES): 14.0000 mL/h | INTRAMUSCULAR | Status: DC | PRN

## 2014-02-17 MED ORDER — BENZOCAINE-MENTHOL 20-0.5 % EX AERO
1.0000 "application " | INHALATION_SPRAY | CUTANEOUS | Status: DC | PRN
  Filled 2014-02-17: qty 56

## 2014-02-17 MED ORDER — IBUPROFEN 600 MG PO TABS
600.0000 mg | ORAL_TABLET | Freq: Four times a day (QID) | ORAL | Status: DC
  Administered 2014-02-18 – 2014-02-19 (×7): 600 mg via ORAL
  Filled 2014-02-17 (×7): qty 1

## 2014-02-17 MED ORDER — DIPHENHYDRAMINE HCL 25 MG PO CAPS: 25.0000 mg | ORAL_CAPSULE | Freq: Four times a day (QID) | ORAL | Status: DC | PRN

## 2014-02-17 MED ORDER — TERBUTALINE SULFATE 1 MG/ML IJ SOLN: 0.2500 mg | Freq: Once | INTRAMUSCULAR | Status: DC | PRN

## 2014-02-17 MED ORDER — WITCH HAZEL-GLYCERIN EX PADS
1.0000 "application " | MEDICATED_PAD | CUTANEOUS | Status: DC | PRN
  Administered 2014-02-18: 1 via TOPICAL

## 2014-02-17 MED ORDER — ONDANSETRON HCL 4 MG/2ML IJ SOLN: 4.0000 mg | INTRAMUSCULAR | Status: DC | PRN

## 2014-02-17 MED ORDER — LACTATED RINGERS IV SOLN
INTRAVENOUS | Status: DC
  Administered 2014-02-17: 18:00:00 via INTRAUTERINE

## 2014-02-17 MED ORDER — DIPHENHYDRAMINE HCL 50 MG/ML IJ SOLN: 12.5000 mg | INTRAMUSCULAR | Status: DC | PRN

## 2014-02-17 MED ORDER — SIMETHICONE 80 MG PO CHEW: 80.0000 mg | CHEWABLE_TABLET | ORAL | Status: DC | PRN

## 2014-02-17 MED ORDER — EPHEDRINE 5 MG/ML INJ
INTRAVENOUS | Status: AC
  Filled 2014-02-17: qty 4

## 2014-02-17 MED ORDER — FENTANYL 2.5 MCG/ML BUPIVACAINE 1/10 % EPIDURAL INFUSION (WH - ANES)
INTRAMUSCULAR | Status: AC
  Filled 2014-02-17: qty 125

## 2014-02-17 NOTE — Progress Notes (Signed)
OB PN:  S: Pt resting comfortably, no acute complaints.  Feeling painful irregular contractions  O: BP 108/64  Pulse 89  Temp(Src) 98.1 F (36.7 C) (Oral)  Resp 18  Ht 5\' 7"  (1.702 m)  Wt 84.823 kg (187 lb)  BMI 29.28 kg/m2  SpO2 100%  FHT: 120bpm, moderate variablity, + accels, occasional variable decels Toco: irregular SVE: 2/60/-3  A/P: 23 y.o. G1P0 @ [redacted]w[redacted]d for IOL due to postdates 1. FWB: Cat. II, FHT overall FHT reassuring 2. Labor: continue IOL with Pit per protocol Pain: epidural when requested GBS: negative -Bipolar d/o, anxiety: continue on daily medications  -POTS: continue Natelol daily  Myna Hidalgo, DO [phone] (pager) [phone] (office)

## 2014-02-17 NOTE — Anesthesia Preprocedure Evaluation (Signed)
Anesthesia Evaluation  Patient identified by MRN, date of birth, ID band Patient awake    Reviewed: Allergy & Precautions, H&P , NPO status , Patient's Chart, lab work & pertinent test results  Airway Mallampati: II TM Distance: >3 FB Neck ROM: full    Dental no notable dental hx.    Pulmonary former smoker,    Pulmonary exam normal       Cardiovascular negative cardio ROS      Neuro/Psych negative neurological ROS     GI/Hepatic Neg liver ROS,   Endo/Other  negative endocrine ROS  Renal/GU negative Renal ROS     Musculoskeletal   Abdominal Normal abdominal exam  (+)   Peds  Hematology negative hematology ROS (+)   Anesthesia Other Findings   Reproductive/Obstetrics (+) Pregnancy                           Anesthesia Physical Anesthesia Plan  ASA: II  Anesthesia Plan: Epidural   Post-op Pain Management:    Induction:   Airway Management Planned:   Additional Equipment:   Intra-op Plan:   Post-operative Plan:   Informed Consent: I have reviewed the patients History and Physical, chart, labs and discussed the procedure including the risks, benefits and alternatives for the proposed anesthesia with the patient or authorized representative who has indicated his/her understanding and acceptance.     Plan Discussed with:   Anesthesia Plan Comments:         Anesthesia Quick Evaluation

## 2014-02-17 NOTE — Lactation Note (Signed)
This note was copied from the chart of Carly Amily Carragher. Lactation Consultation Note  Patient Name: Carly Reyes [id number] Date: 02/17/2014 Reason for consult: Other (Comment) (charting for exclusion based on mother's choice and medications)   Maternal Data Formula Feeding for Exclusion: Yes Reason for exclusion: Mother's choice to formula feed on admision;Taking certain medications (see LC note)  Feeding    LATCH Score/Interventions                      Lactation Tools Discussed/Used     Consult Status Consult Status: PRN    Zara Chess 02/17/2014, 10:11 PM

## 2014-02-17 NOTE — Progress Notes (Signed)
OB PN:  S: Pt resting comfortably, no acute complaints  O: BP 122/71  Pulse 90  Temp(Src) 98.1 F (36.7 C) (Oral)  Resp 18  Ht 5\' 7"  (1.702 m)  Wt 84.823 kg (187 lb)  BMI 29.28 kg/m2  FHT: 120bpm, moderate variablity, + accels, occasional variable decels Toco: irregular SVE: deferred, last performed by RN @ 0415: 1/50/-2  A/P: 23 y.o. G1P0 @ [redacted]w[redacted]d for IOL due to postdates 1. FWB: Cat. II, FHT overall FHT reassuring 2. Labor: s/p cytotec x 1, will continue IOL with Pit Pain: epidural when requested GBS: negative -Bipolar d/o, anxiety: continue on daily medications  -POTS: continue Natelol daily  Myna Hidalgo, DO [phone] (pager) [phone] (office)

## 2014-02-17 NOTE — Anesthesia Procedure Notes (Signed)
Epidural Patient location during procedure: OB Start time: 02/17/2014 3:28 PM End time: 02/17/2014 3:32 PM  Staffing Anesthesiologist: Leilani Able Performed by: anesthesiologist   Preanesthetic Checklist Completed: patient identified, surgical consent, pre-op evaluation, timeout performed, IV checked, risks and benefits discussed and monitors and equipment checked  Epidural Patient position: sitting Prep: site prepped and draped and DuraPrep Patient monitoring: continuous pulse ox and blood pressure Approach: midline Location: L3-L4 Injection technique: LOR air  Needle:  Needle type: Tuohy  Needle gauge: 17 G Needle length: 9 cm and 9 Needle insertion depth: 5 cm cm Catheter type: closed end flexible Catheter size: 19 Gauge Catheter at skin depth: 10 cm Test dose: negative and Other  Assessment Sensory level: T9 Events: blood not aspirated, injection not painful, no injection resistance, negative IV test and no paresthesia  Additional Notes Reason for block:procedure for pain

## 2014-02-17 NOTE — Progress Notes (Signed)
OB PN:  S: Pt resting comfortably s/p epidural  O: BP 118/60  Pulse 78  Temp(Src) 98.4 F (36.9 C) (Oral)  Resp 18  Ht 5\' 7"  (1.702 m)  Wt 84.823 kg (187 lb)  BMI 29.28 kg/m2  SpO2 100%  FHT: 120bpm, moderate variablity, + accels, no decels Toco: q67min SVE: 4/75/-2, AROM- thick meconium, IUPC & FSE placed  CBC    Component Value Date/Time   WBC 12.6* 02/16/2014 1922   RBC 4.00 02/16/2014 1922   HGB 12.5 02/16/2014 1922   HCT 35.3* 02/16/2014 1922   PLT 250 02/16/2014 1922   MCV 88.3 02/16/2014 1922   MCH 31.3 02/16/2014 1922   MCHC 35.4 02/16/2014 1922   RDW 12.9 02/16/2014 1922   LYMPHSABS 1.7 11/25/2009 1038   MONOABS 0.3 11/25/2009 1038   EOSABS 0.0 11/25/2009 1038   BASOSABS 0.0 11/25/2009 1038    A/P: 23 y.o. G1P0 @ [redacted]w[redacted]d for IOL due to postdates 1. FWB: Cat. I 2. Labor: continue Pit per protocol, internal monitors in place Pain: continue epidural GBS: negative -Bipolar d/o, anxiety: continue on daily medications  -POTS: continue Natelol daily  Myna Hidalgo, DO [phone] (pager) [phone] (office)

## 2014-02-18 LAB — CULTURE, GROUP A STREP

## 2014-02-18 LAB — CBC
HCT: 28.5 % — ABNORMAL LOW (ref 36.0–46.0)
Hemoglobin: 9.5 g/dL — ABNORMAL LOW (ref 12.0–15.0)
MCH: 29.7 pg (ref 26.0–34.0)
MCHC: 33.3 g/dL (ref 30.0–36.0)
MCV: 89.1 fL (ref 78.0–100.0)
Platelets: 193 10*3/uL (ref 150–400)
RBC: 3.2 MIL/uL — ABNORMAL LOW (ref 3.87–5.11)
RDW: 12.9 % (ref 11.5–15.5)
WBC: 13.6 10*3/uL — ABNORMAL HIGH (ref 4.0–10.5)

## 2014-02-18 MED ORDER — IBUPROFEN 600 MG PO TABS: 600.0000 mg | ORAL_TABLET | Freq: Four times a day (QID) | ORAL | Status: DC

## 2014-02-18 NOTE — Progress Notes (Signed)
Postpartum Note Day # 1  S:  Patient resting comfortable in bed.  Pain controlled.  Tolerating general. Lochia moderate.  Ambulating without difficulty.  She denies n/v/f/c, SOB, or CP.  Pt plans on bottle feeding  O: Temp:  [98 F (36.7 C)-99.4 F (37.4 C)] 98 F (36.7 C) (05/27 0440) Pulse Rate:  [73-100] 73 (05/27 0440) Resp:  [18-20] 18 (05/27 0440) BP: (101-142)/(54-97) 117/73 mmHg (05/27 0440) SpO2:  [98 %-100 %] 99 % (05/27 0440)  Gen: A&Ox3, NAD CV: RRR, no MRG Abdomen: soft, NT, ND Uterus: firm, non-tender, below umbilicus Ext: 1+ non-pitting edema bilaterally, no calf tenderness bilaterally  Labs:  Recent Labs  02/16/14 1922 02/18/14 0609  HGB 12.5 9.5*    A/P: Pt is a 23 y.o. G1P1001 s/p NSVD, PPD#1  - Pain well controlled -GU: UOP is adequate -GI: Tolerating general diet -Activity: encouraged sitting up to chair and ambulation as tolerated -Prophylaxis: encourage ambulation -Labs: as above, will start on iron daily -Bipolar/anxiety disorder: continue Lamictal, Seroquel and Zoloft -POTS: continue Nadelol 20mg  daily, pt to follow up with cardiology as outpatient -baby boy circ to be performed later today  Myna Hidalgo, DO [phone] (pager) [phone] (office)

## 2014-02-18 NOTE — Discharge Instructions (Signed)

## 2014-02-18 NOTE — Anesthesia Postprocedure Evaluation (Signed)
Anesthesia Post Note  Patient: Carly Reyes  Procedure(s) Performed: * No procedures listed *  Anesthesia type: Epidural  Patient location: Mother/Baby  Post pain: Pain level controlled  Post assessment: Post-op Vital signs reviewed  Last Vitals:  Filed Vitals:   02/18/14 0440  BP: 117/73  Pulse: 73  Temp: 36.7 C  Resp: 18    Post vital signs: Reviewed  Level of consciousness: awake  Complications: No apparent anesthesia complications

## 2014-02-19 MED ORDER — FERROUS SULFATE 325 (65 FE) MG PO TABS: 325.0000 mg | ORAL_TABLET | Freq: Every day | ORAL | Status: DC

## 2014-02-19 NOTE — Progress Notes (Signed)
UR chart review completed.  

## 2014-02-19 NOTE — Progress Notes (Signed)
Postpartum Note Day # 2  S:  Patient resting comfortable in bed.  Pain controlled.  Tolerating general. Lochia moderate. + flatus, no BM Ambulating without difficulty.  She denies n/v/f/c, SOB, or CP.  Pt plans on bottle feeding  O: Temp:  [97.4 F (36.3 C)-98.6 F (37 C)] 97.4 F (36.3 C) (05/28 0604) Pulse Rate:  [75-85] 75 (05/28 0604) Resp:  [18-20] 18 (05/28 0604) BP: (94-120)/(65-68) 94/68 mmHg (05/28 0604) SpO2:  [100 %] 100 % (05/28 0604)  Gen: A&Ox3, NAD CV: RRR, no MRG Lungs: CTAB Abdomen: soft, NT, ND Uterus: firm, non-tender, below umbilicus Ext: 1+ non-pitting pedal edema bilaterally- improving, no calf tenderness bilaterally  Labs:   Recent Labs  02/16/14 1922 02/18/14 0609  HGB 12.5 9.5*    A/P: Pt is a 23 y.o. G1P1001 s/p NSVD, PPD#2  - Pain well controlled -GU: UOP is adequate -GI: Tolerating general diet -Activity: encouraged sitting up to chair and ambulation as tolerated -Prophylaxis: encourage ambulation -Labs: as above, PNV with iron daily -Bipolar/anxiety disorder: continue Lamictal, Seroquel and Zoloft -POTS: continue Nadelol 20mg  daily, pt to follow up with cardiology as outpatient -baby boy circ completed  DISPO: Pt meeting postpartum milestones appropriately, plan for discharge home today  Myna Hidalgo, DO [phone] (pager) [phone] (office)

## 2014-02-19 NOTE — Discharge Summary (Signed)
Obstetric Discharge Summary Reason for Admission: induction of labor Prenatal Procedures: none Intrapartum Procedures: spontaneous vaginal delivery Postpartum Procedures: none Complications-Operative and Postpartum: vaginal wall and right labia lacerations Hemoglobin  Date Value Ref Range Status  02/18/2014 9.5* 12.0 - 15.0 g/dL Final     DELTA CHECK NOTED     REPEATED TO VERIFY     HCT  Date Value Ref Range Status  02/18/2014 28.5* 36.0 - 46.0 % Final   Hospital Course: 23yo G1P0 @ [redacted]w[redacted]d who presented for IOL due to postdates.  Induction was started with cytotec and continued with Pitocin per protocol.  She received an epidural for pain.  Due to Cat. II tracing, upon AROM of thick meconium, internal monitors (IUPC & FSE) were placed.  Amnioinfusion was started due to occasional variable decels.  The patient progressed to complete, NSVD without complications performed by Dr. Charlotta Reyes.  Right labial tear was repaired with 3-0 chromic and the lateral side wall tears were repaired with 2-0 and 3-0 vicryl in a running fashion.  Her postpartum course was uncomplicated and she was discharged home in stable condition on PPD#2  Physical Exam:  General: alert and no distress Lochia: appropriate Uterine Fundus: below umbilicus DVT Evaluation: No evidence of DVT seen on physical exam.  Discharge Diagnoses: Term Pregnancy-delivered  Discharge Information: Date: 02/19/2014 Activity: pelvic rest Diet: routine Medications: PNV, Ibuprofen and Colace Condition: stable Instructions: refer to practice specific booklet Discharge to: home Follow-up Information   Follow up with Carly Reyes, M, DO In 6 weeks.   Specialty:  Obstetrics and Gynecology   Contact information:   [address] [place] [address] [phone]       Newborn Data: Live born female  Birth Weight: 5 lb 12.6 oz (2625 g) APGAR: 8, 8  Home with mother.  Carly Reyes 02/19/2014, 7:25 AM

## 2014-02-19 NOTE — Clinical Social Work Maternal (Addendum)
LATE ENTRY FROM 02/18/14:  Clinical Social Work Department PSYCHOSOCIAL ASSESSMENT - MATERNAL/CHILD 02/19/2014  Patient:  Carly Reyes, Carly Reyes  Account Number:  [id number]  Admit Date:  02/16/2014  Marjo Bicker Name:   Wonda Cheng    Clinical Social Worker:  Nobie Putnam, LCSW   Date/Time:  02/18/2014 12:58 PM  Date Referred:  02/18/2014   Referral source  CN     Referred reason  Behavioral Health Issues  Substance Abuse   Other referral source:    I:  FAMILY / HOME ENVIRONMENT Child's legal guardian:  PARENT  Guardian - Name Guardian - Age Guardian - Address  Carly Reyes 23 [address]; [place], [place] [address]  Carly Reyes 20 (same as above)   Other household support members/support persons Other support:   Family/Friends    II  PSYCHOSOCIAL DATA Information Source:  Patient Interview  Event organiser Employment:   Surveyor, quantity resources:  [employer] If Medicaid - County:  [place] Other  Sales executive  WIC   School / Grade:   Maternity Care Coordinator / Child Services Coordination / Early Interventions:  Cultural issues impacting care:    III  STRENGTHS Strengths  Adequate Resources  Home prepared for Child (including basic supplies)  Supportive family/friends   Strength comment:    IV  RISK FACTORS AND CURRENT PROBLEMS Current Problem:  YES   Risk Factor & Current Problem Patient Issue Family Issue Risk Factor / Current Problem Comment  Mental Illness Y N Hx of bipolar disorder & anxiety  Substance Abuse Y N Hx of MJ use    V  SOCIAL WORK ASSESSMENT CSW referral received to assess pts history of mental illness diagnoses & MJ use.  Pt was accompanied by FOB but gave CSW verbal permission to speak in his presence.  Pt lives with FOB.  This is their first child.  Pt told CSW that she was diagnosed with bipolar disorder in 2007 & anxiety disorder in 2004 or '05.  Pts symptoms have been treated with Lamictal, Seroquel & Zoloft,  since 2011. Currently, her mental health is managed by Dr. Yetta Barre @ [facility].  She is seen every 6-8 weeks plans to continue care upon discharge.  According to pt, the medication is helpful.  She denies any recent or current depression/SI.  CSW encouraged pt to continue mental health treatment upon discharge & discussed signs/symptoms of PP depression.  CSW inquired about pts MJ use.  Pt admits to smoking MJ "everyday" prior to pregnancy confirmation at 7 weeks. Once pregnancy was confirmed, she continued to smoke until Dec.'14.  Pt explained that she had to stop smoking because she was put on supervised probation.  Since pt is on probation, she will be randomly drug tested every month. She is on probation until Jan. '17.  Pt told CSW that she violated previous probation in April '14 when she received a DUI.  Initially, pt was on probation for "petty theft." She states she is not facing any jail time.  CSW explained hospital drug testing policy & pt verbalized an understanding.  UDS is negative, meconium results are pending.  FOB was at the bedside, aware of history & seemed supportive.  The couple have all the necessary supplies for the infant.  CSW will continue to monitor drug screen results & make a CPS referral if needed.      VI SOCIAL WORK PLAN Social Work Plan  No Further Intervention Required / No Barriers to Discharge   Type  of pt/family education:   If child protective services report - county:   If child protective services report - date:   Information/referral to community resources comment:   Other social work plan:

## 2014-03-31 ENCOUNTER — Other Ambulatory Visit (HOSPITAL_COMMUNITY)
Admission: RE | Admit: 2014-03-31 | Discharge: 2014-03-31 | Disposition: A | Discharge status: Home / Self Care | Payer: Medicaid Other | Source: Ambulatory Visit | Attending: Obstetrics & Gynecology | Admitting: Obstetrics & Gynecology

## 2014-03-31 ENCOUNTER — Other Ambulatory Visit: Payer: Self-pay | Admitting: Obstetrics & Gynecology

## 2014-03-31 DIAGNOSIS — Z01419 Encounter for gynecological examination (general) (routine) without abnormal findings: Secondary | ICD-10-CM | POA: Insufficient documentation

## 2014-04-01 LAB — CYTOLOGY - PAP

## 2014-04-21 ENCOUNTER — Ambulatory Visit (INDEPENDENT_AMBULATORY_CARE_PROVIDER_SITE_OTHER): Payer: Medicaid Other | Admitting: Internal Medicine

## 2014-04-21 ENCOUNTER — Encounter: Payer: Self-pay | Admitting: Internal Medicine

## 2014-04-21 VITALS — BP 131/86 | HR 106 | Ht 67.0 in | Wt 172.0 lb

## 2014-04-21 DIAGNOSIS — G90A Postural orthostatic tachycardia syndrome (POTS): Secondary | ICD-10-CM | POA: Insufficient documentation

## 2014-04-21 DIAGNOSIS — I951 Orthostatic hypotension: Principal | ICD-10-CM

## 2014-04-21 DIAGNOSIS — I498 Other specified cardiac arrhythmias: Secondary | ICD-10-CM | POA: Insufficient documentation

## 2014-04-21 DIAGNOSIS — R000 Tachycardia, unspecified: Principal | ICD-10-CM

## 2014-04-21 HISTORY — DX: Postural orthostatic tachycardia syndrome (POTS): G90.A

## 2014-04-21 HISTORY — DX: Other specified cardiac arrhythmias: I49.8

## 2014-04-21 NOTE — Progress Notes (Signed)
      Patient has no care team.   HPI  Carly Reyes is a 23 y.o. female Seen in followup for symptoms most consistent with POTS. She also has anxiety and bipolar disorder.  She delivered her son,  Carly Reyes, a few months ago.  Shes had one intercurrent episode of syncope that occurred while she was cleaning her house on a day which has not air-conditioning. The house was hot and she was dehydrated. She had typical prodrome.   Past Medical History  Diagnosis Date  . Pott's disease     . Bipolar 1 disorder   . GERD (gastroesophageal reflux disease)   . Depression     Past Surgical History  Procedure Laterality Date  . Tonsillectomy      Current Outpatient Prescriptions  Medication Sig Dispense Refill  . acetaminophen (TYLENOL) 325 MG tablet Take 650 mg by mouth every 6 (six) hours as needed for moderate pain.      . famotidine (PEPCID) 20 MG tablet Take 1 tablet (20 mg total) by mouth 2 (two) times daily.  15 tablet  0  . lamoTRIgine (LAMICTAL) 200 MG tablet Take 200 mg by mouth daily.      . nadolol (CORGARD) 20 MG tablet Take 1 tablet (20 mg total) by mouth daily.  30 tablet  3  . Prenatal Vit-Fe Fumarate-FA (PRENATAL MULTIVITAMIN) TABS tablet Take 1 tablet by mouth daily at 12 noon.      . QUEtiapine (SEROQUEL XR) 300 MG 24 hr tablet Take 300 mg by mouth at bedtime.      . sertraline (ZOLOFT) 100 MG tablet Take 100 mg by mouth daily.       No current facility-administered medications for this visit.    No Known Allergies  Review of Systems negative except from HPI and PMH  Physical Exam BP 131/86  Pulse 106  Ht 5\' 7"  (1.702 m)  Wt 172 lb (78.019 kg)  BMI 26.93 kg/m2 Well developed and well nourished in no acute distress HENT normal E scleral and icterus clear Neck Supple JVP flat; carotids brisk and full Clear to ausculation  Regular rate and rhythm, no murmurs gallops or rub Soft with active bowel sounds No clubbing cyanosis no Edema Alert and  oriented, grossly normal motor and sensory function Skin Warm and Dry    Assessment and  Plan  POTS  Bipolar/anxiety  Pregnancy-delivered  She has done amazingly well. She had one episode of syncope when she was cleaning her house without air conditioning on; they're trying to save money on cooling pills. She had adequate warning. She is also relatively dehydrated.

## 2014-04-21 NOTE — Patient Instructions (Signed)
Your physician recommends that you continue on your current medications as directed. Please refer to the Current Medication list given to you today.  Your physician wants you to follow-up in: 10/11 months with Dr. Graciela HusbandsKlein.  You will receive a reminder letter in the mail two months in advance. If you don't receive a letter, please call our office to schedule the follow-up appointment.   Thank you for visiting with [facility] today!!

## 2014-05-23 ENCOUNTER — Other Ambulatory Visit: Payer: Self-pay | Admitting: Internal Medicine

## 2014-07-27 ENCOUNTER — Encounter: Payer: Self-pay | Admitting: Internal Medicine

## 2014-08-05 ENCOUNTER — Telehealth: Payer: Self-pay

## 2014-08-05 NOTE — Telephone Encounter (Signed)
Patient tells me she is out of medication -  and unfortunately we do not have any samples in office.  I called [facility] and asked them to fax PA form. They will fax to [phone], per my request. Informed patient that I would call her once PA completed and she is agreeable to this.

## 2014-08-05 NOTE — Telephone Encounter (Signed)
Informed patient prior auth for Nadolol called [facility] at [phone]. 24 hour turn around response. Confirmation number [id number].

## 2014-08-05 NOTE — Telephone Encounter (Signed)
lmtcb

## 2014-08-06 ENCOUNTER — Telehealth: Payer: Self-pay

## 2014-08-06 NOTE — Telephone Encounter (Signed)
Called patient and [facility] to let them know that the PA was approved for her to get her nadolol. Approved until 11.11.2016

## 2014-09-07 ENCOUNTER — Telehealth: Payer: Self-pay | Admitting: Internal Medicine

## 2014-09-07 NOTE — Telephone Encounter (Signed)
New Msg     Pt calling states she would like to have a prescription for Lisinopril. Pt currently on Nadalol but no longer has medicaid

## 2014-09-07 NOTE — Telephone Encounter (Signed)
She is currently out of Nadolol. i explained that we do not have an order for a new medications yet. Advised to ask the pharmacist for 1 pill to take today. Voiced good understanding.

## 2014-09-07 NOTE — Telephone Encounter (Signed)
No longer has health insurance. Nadolol is too much $$$ at [facility]. i have asked her to call [facility] and see if Nadolol is on their $4.00 list of drugs. If not then inquire which Beta Blockers are. She voiced good understanding. Will call us back

## 2014-09-07 NOTE — Telephone Encounter (Signed)
New Msg     Pt calling states she would like to have a prescription for Lisinopril, pt currently on Nadalol but no longer has medicaid.  Please contact at [phone].

## 2014-09-07 NOTE — Telephone Encounter (Signed)
New message      Pt does not have medicaid any more.  Her nadolol is too expensive and does not come in samples.  Can you give her samples of something else?

## 2014-09-07 NOTE — Telephone Encounter (Signed)
i called [facility], and the only beta blocker on their list is Bisoprolol.

## 2014-09-08 MED ORDER — BISOPROLOL FUMARATE 5 MG PO TABS: ORAL_TABLET | ORAL | Status: DC

## 2014-09-08 NOTE — Telephone Encounter (Signed)
Per verbal order of Dr. Graciela HusbandsKlein, start Bisoprolol 2.5 mg daily. [facility] [address]

## 2014-09-14 NOTE — Telephone Encounter (Signed)
Follow up      Pt want bisoprolol 2.5mg  [facility] [address]  [facility]

## 2014-09-15 NOTE — Telephone Encounter (Signed)
Patient was worried about getting Nadolol filled - [facility] stating denied. [facility]iscussed [facility] - turns out patient was using old pregnancy medicaid which denies this medication. Patient recently approved for regular Medicaid. Informed her it is covered under regular medicaid and to try running new information through. Patient is agreeable and will call back if needs us to help further.

## 2014-09-15 NOTE — Telephone Encounter (Signed)
F/U        Pt has questions about getting medications.  Having trouble getting prescriptions filled, just got medicaid approved.  Pt not feeling well hasn't had meds in 3 days. Please call.

## 2014-10-14 ENCOUNTER — Other Ambulatory Visit: Payer: Self-pay | Admitting: Internal Medicine

## 2014-10-19 NOTE — Telephone Encounter (Signed)
Carly Reyes patient called back stating that she was denied for regular medicaid and wants to know if there was something cheaper that was on the 4 dollar plan

## 2014-10-20 ENCOUNTER — Other Ambulatory Visit: Payer: Self-pay | Admitting: *Deleted

## 2014-10-20 MED ORDER — ATENOLOL 25 MG PO TABS: 25.0000 mg | ORAL_TABLET | Freq: Two times a day (BID) | ORAL | Status: DC

## 2014-10-20 NOTE — Telephone Encounter (Signed)
Informed patient that I would review with Dr.Klein and let her know recommendations. She is agreeable and appreciative.

## 2014-10-21 NOTE — Telephone Encounter (Addendum)
Late Entry: Called patient last night, 5:50pm - Advised to start Atenolol 25 mg BID. [facility] Patient is agreeable.

## 2014-12-10 ENCOUNTER — Other Ambulatory Visit: Payer: Self-pay | Admitting: Internal Medicine

## 2015-02-23 ENCOUNTER — Ambulatory Visit (INDEPENDENT_AMBULATORY_CARE_PROVIDER_SITE_OTHER): Payer: Self-pay | Admitting: Internal Medicine

## 2015-02-23 ENCOUNTER — Encounter: Payer: Self-pay | Admitting: Internal Medicine

## 2015-02-23 VITALS — BP 120/78 | HR 74 | Ht 67.0 in | Wt 134.2 lb

## 2015-02-23 DIAGNOSIS — I498 Other specified cardiac arrhythmias: Secondary | ICD-10-CM

## 2015-02-23 DIAGNOSIS — I951 Orthostatic hypotension: Secondary | ICD-10-CM

## 2015-02-23 DIAGNOSIS — R000 Tachycardia, unspecified: Secondary | ICD-10-CM

## 2015-02-23 DIAGNOSIS — G90A Postural orthostatic tachycardia syndrome (POTS): Secondary | ICD-10-CM

## 2015-02-23 NOTE — Patient Instructions (Signed)
Medication Instructions:  Your physician recommends that you continue on your current medications as directed. Please refer to the Current Medication list given to you today.  Labwork: None ordered  Testing/Procedures: None ordered  Follow-Up: Your physician wants you to follow-up in: 1 year with Dr. Klein.  You will receive a reminder letter in the mail two months in advance. If you don't receive a letter, please call our office to schedule the follow-up appointment.   Thank you for choosing [facility]!!          

## 2015-02-23 NOTE — Progress Notes (Signed)
      No care team member to display   HPI  Carly Reyes is a 24 y.o. female Seen in followup for symptoms most consistent with POTS. She also has anxiety and bipolar disorder.  She delivered her son,  Carly Reyes, a year   ago.  Shes had no intercurrent episode of syncope  She's had minimal dizziness. He remains her biggest challenge. She is tolerating the atenolol well. Nadolol was less affordable.  Past Medical History  Diagnosis Date  . Bipolar 1 disorder   . GERD (gastroesophageal reflux disease)   . Depression   . POTS (postural orthostatic tachycardia syndrome) 04/21/2014    Past Surgical History  Procedure Laterality Date  . Tonsillectomy      Current Outpatient Prescriptions  Medication Sig Dispense Refill  . atenolol (TENORMIN) 25 MG tablet TAKE 1 TABLET TWICE A DAY 60 tablet 3  . lamoTRIgine (LAMICTAL) 200 MG tablet Take 200 mg by mouth daily.    . QUEtiapine (SEROQUEL XR) 300 MG 24 hr tablet Take 300 mg by mouth at bedtime.    . sertraline (ZOLOFT) 100 MG tablet Take 100 mg by mouth daily.     No current facility-administered medications for this visit.    No Known Allergies  Review of Systems negative except from HPI and PMH  Physical Exam BP 120/78 mmHg  Pulse 74  Ht 5\' 7"  (1.702 m)  Wt 134 lb 3.2 oz (60.873 kg)  BMI 21.01 kg/m2 Well developed and well nourished in no acute distress HENT normal E scleral and icterus clear Neck Supple JVP flat; carotids brisk and full Clear to ausculation  Regular rate and rhythm, no murmurs gallops or rub Soft with active bowel sounds No clubbing cyanosis no Edema Alert and oriented, grossly normal motor and sensory function Skin Warm and Dry    Assessment and  Plan  POTS  Bipolar/anxiety  Cigarette abuse  STOPPED SMOKING   YEAH !!!!!!  Pregnancy-delivered  She continues to do well. She will reinitiate ThermaTabs for the summer;  she will think about taking atenolol with low doses in the  morning

## 2015-06-01 ENCOUNTER — Telehealth: Payer: Self-pay

## 2015-06-03 NOTE — Telephone Encounter (Signed)
Left another message informing patient that I would not be in the office tomorrow and asked her to call me next week to discuss this.

## 2015-06-08 NOTE — Telephone Encounter (Signed)
No problem To switch

## 2015-06-15 MED ORDER — NADOLOL 20 MG PO TABS: 20.0000 mg | ORAL_TABLET | Freq: Every day | ORAL | Status: DC

## 2015-06-15 NOTE — Addendum Note (Signed)
Addended by: Baird Lyons on: 06/15/2015 11:20 AM   Modules accepted: Orders, Medications

## 2015-06-15 NOTE — Telephone Encounter (Addendum)
Discussed with patient. Patient aware to take last dose of Atenolol today and start Nadolol tomorrow. [facility] per pt request.

## 2015-08-26 ENCOUNTER — Other Ambulatory Visit: Payer: Self-pay | Admitting: Internal Medicine

## 2016-06-20 ENCOUNTER — Other Ambulatory Visit: Payer: Self-pay | Admitting: Internal Medicine

## 2016-08-02 ENCOUNTER — Other Ambulatory Visit: Payer: Self-pay | Admitting: Internal Medicine

## 2016-08-03 ENCOUNTER — Telehealth: Payer: Self-pay | Admitting: Internal Medicine

## 2016-08-03 ENCOUNTER — Other Ambulatory Visit: Payer: Self-pay | Admitting: *Deleted

## 2016-08-03 MED ORDER — NADOLOL 20 MG PO TABS: 20.0000 mg | ORAL_TABLET | Freq: Every day | ORAL | 1 refills | Status: DC

## 2016-08-03 NOTE — Telephone Encounter (Signed)
Pt needed an appt to get refill of Nadalol 20mg  [facility] [address] -needed an appt-appt made 10-05-16

## 2016-10-05 ENCOUNTER — Ambulatory Visit (INDEPENDENT_AMBULATORY_CARE_PROVIDER_SITE_OTHER): Payer: BLUE CROSS/BLUE SHIELD | Admitting: Internal Medicine

## 2016-10-05 ENCOUNTER — Encounter: Payer: Self-pay | Admitting: Internal Medicine

## 2016-10-05 ENCOUNTER — Encounter (INDEPENDENT_AMBULATORY_CARE_PROVIDER_SITE_OTHER): Payer: Self-pay

## 2016-10-05 VITALS — BP 116/69 | HR 69 | Ht 67.0 in | Wt 138.0 lb

## 2016-10-05 DIAGNOSIS — I951 Orthostatic hypotension: Secondary | ICD-10-CM

## 2016-10-05 DIAGNOSIS — R000 Tachycardia, unspecified: Secondary | ICD-10-CM

## 2016-10-05 DIAGNOSIS — I498 Other specified cardiac arrhythmias: Secondary | ICD-10-CM

## 2016-10-05 DIAGNOSIS — G90A Postural orthostatic tachycardia syndrome (POTS): Secondary | ICD-10-CM

## 2016-10-05 MED ORDER — NADOLOL 20 MG PO TABS: 20.0000 mg | ORAL_TABLET | Freq: Every day | ORAL | 11 refills | Status: DC

## 2016-10-05 NOTE — Patient Instructions (Signed)
Medication Instructions: - Your physician recommends that you continue on your current medications as directed. Please refer to the Current Medication list given to you today.  Labwork: - none ordered  Procedures/Testing: - none ordered  Follow-Up: - Your physician wants you to follow-up in: 1 year with Gypsy BalsamAmber Seiler, NP for Dr. Graciela HusbandsKlein & 2 years with Dr. Graciela HusbandsKlein.  You will receive a reminder letter in the mail two months in advance. If you don't receive a letter, please call our office to schedule the follow-up appointment.  Any Additional Special Instructions Will Be Listed Below (If Applicable).     If you need a refill on your cardiac medications before your next appointment, please call your pharmacy.

## 2016-10-05 NOTE — Progress Notes (Signed)
      No care team member to display   HPI  Carly HartsStephanie B Reyes is a 26 y.o. female Seen in followup for symptoms most consistent with POTS. She also has anxiety and bipolar disorder.  She has done great without any significant dizziness or exercise intolerance, symptoms only noted if she misses her meds.  Tolerated the summer easily      Past Medical History:  Diagnosis Date  . Bipolar 1 disorder (HCC)   . Depression   . GERD (gastroesophageal reflux disease)   . POTS (postural orthostatic tachycardia syndrome) 04/21/2014    Past Surgical History:  Procedure Laterality Date  . TONSILLECTOMY      Current Outpatient Prescriptions  Medication Sig Dispense Refill  . lamoTRIgine (LAMICTAL) 200 MG tablet Take 200 mg by mouth daily.    . nadolol (CORGARD) 20 MG tablet Take 1 tablet (20 mg total) by mouth daily. **Please keep January 2018 appointment for further refills* 30 tablet 1  . QUEtiapine (SEROQUEL XR) 300 MG 24 hr tablet Take 300 mg by mouth at bedtime.    . sertraline (ZOLOFT) 100 MG tablet Take 100 mg by mouth daily.     No current facility-administered medications for this visit.     No Known Allergies  Review of Systems negative except from HPI and PMH  Physical Exam BP 116/69   Pulse 69   Ht 5\' 7"  (1.702 m)   Wt 138 lb (62.6 kg)   LMP 09/04/2016   SpO2 99%   BMI 21.61 kg/m  Well developed and well nourished in no acute distress HENT normal E scleral and icterus clear Neck Supple JVP flat; carotids brisk and full Clear to ausculation  Regular rate and rhythm, no murmurs gallops or rub Soft with active bowel sounds No clubbing cyanosis no Edema Alert and oriented, grossly normal motor and sensory function Skin Warm and Dry    Assessment and  Plan  POTS  Bipolar/anxiety  Cigarette REabuse        Overall doing well   Continue current meds   Will see in one year

## 2017-10-29 ENCOUNTER — Other Ambulatory Visit: Payer: Self-pay | Admitting: Internal Medicine

## 2017-12-03 ENCOUNTER — Other Ambulatory Visit: Payer: Self-pay | Admitting: Nurse Practitioner

## 2017-12-03 MED ORDER — NADOLOL 20 MG PO TABS: 20.0000 mg | ORAL_TABLET | Freq: Every day | ORAL | 0 refills | Status: DC

## 2017-12-03 NOTE — Telephone Encounter (Signed)
New message      *STAT* If patient is at the pharmacy, call can be transferred to refill team.   1. Which medications need to be refilled? (please list name of each medication and dose if known)  nadolol (CORGARD) 20 MG tablet Take 1 tablet (20 mg total) by mouth daily. Please make yearly appt with Gypsy BalsamAmber Seiler, NP before anymore refills. 1st attempt     2. Which pharmacy/location (including street and city if local pharmacy) is medication [facility] [address]   3. Do they need a 30 day or 90 day supply?  Enough to get her to 3/20./19 appt

## 2017-12-03 NOTE — Telephone Encounter (Signed)
Pt's medication was sent to pt's pharmacy as requested. Confirmation received.  °

## 2017-12-10 NOTE — Progress Notes (Signed)
Electrophysiology Office Note Date: 12/12/2017  ID:  Carly HartsStephanie B Braddock, DOB [birth year], MRN [id number]  PCP: Patient, No Pcp Per Electrophysiologist: Graciela HusbandsKlein  CC: POTS follow up  Carly Reyes is a 27 y.o. female seen today for Dr Graciela HusbandsKlein.  She presents today for routine electrophysiology followup.  Since last being seen in our clinic, the patient reports doing very well. She has not had significant symptoms in the last 2 years. She works at [employer].  She denies chest pain, palpitations, dyspnea, PND, orthopnea, nausea, vomiting, dizziness, syncope, edema, weight gain, or early satiety.  Past Medical History:  Diagnosis Date  . Bipolar 1 disorder (HCC)   . Depression   . GERD (gastroesophageal reflux disease)   . POTS (postural orthostatic tachycardia syndrome) 04/21/2014   Past Surgical History:  Procedure Laterality Date  . TONSILLECTOMY      Current Outpatient Medications  Medication Sig Dispense Refill  . lamoTRIgine (LAMICTAL) 200 MG tablet Take 200 mg by mouth daily.    . nadolol (CORGARD) 20 MG tablet Take 1 tablet (20 mg total) by mouth daily. Please keep upcoming appt for future refills. Thank you 90 tablet 3  . QUEtiapine (SEROQUEL XR) 300 MG 24 hr tablet Take 300 mg by mouth at bedtime.    . sertraline (ZOLOFT) 100 MG tablet Take 100 mg by mouth daily.     No current facility-administered medications for this visit.     Allergies:   Patient has no known allergies.   Social History: Social History   Socioeconomic History  . Marital status: Single    Spouse name: Not on file  . Number of children: Not on file  . Years of education: Not on file  . Highest education level: Not on file  Social Needs  . Financial resource strain: Not on file  . Food insecurity - worry: Not on file  . Food insecurity - inability: Not on file  . Transportation needs - medical: Not on file  . Transportation needs - non-medical: Not on file  Occupational History  .  Not on file  Tobacco Use  . Smoking status: Former Games developermoker  . Smokeless tobacco: Never Used  Substance and Sexual Activity  . Alcohol use: No  . Drug use: Yes    Types: Marijuana    Comment: prior to pregnancy  . Sexual activity: Yes  Other Topics Concern  . Not on file  Social History Narrative  . Not on file    Family History: Family History  Problem Relation Age of Onset  . Hypertension Mother   . Hypertension Father   . Heart failure Father   . Thyroid disease Father   . Graves' disease Father     Review of Systems: All other systems reviewed and are otherwise negative except as noted above.   Physical Exam: VS:  BP 110/68   Pulse 70   Ht 5\' 7"  (1.702 m)   Wt 138 lb (62.6 kg)   SpO2 99%   BMI 21.61 kg/m  , BMI Body mass index is 21.61 kg/m. Wt Readings from Last 3 Encounters:  12/12/17 138 lb (62.6 kg)  10/05/16 138 lb (62.6 kg)  02/23/15 134 lb 3.2 oz (60.9 kg)    GEN- The patient is well appearing, alert and oriented x 3 today.   HEENT: normocephalic, atraumatic; sclera clear, conjunctiva pink; hearing intact; oropharynx clear; neck supple Lungs- Clear to ausculation bilaterally, normal work of breathing.  No wheezes, rales, rhonchi  Heart- Regular rate and rhythm GI- soft, non-tender, non-distended, bowel sounds present Extremities- no clubbing, cyanosis, or edema MS- no significant deformity or atrophy Skin- warm and dry, no rash or lesion  Psych- euthymic mood, full affect Neuro- strength and sensation are intact   EKG:  EKG is ordered today. EKG today shows SR  Recent Labs: No results found for requested labs within last 8760 hours.    Other studies Reviewed: Additional studies/ records that were reviewed today include: Dr Odessa Fleming office notes   Assessment and Plan:  1.  POTS Stable No change required today   Current medicines are reviewed at length with the patient today.   The patient does not have concerns regarding her medicines.   The following changes were made today:  none  Labs/ tests ordered today include: none Orders Placed This Encounter  Procedures  . EKG 12-Lead     Disposition:   Follow up with Dr Graciela Husbands 1 year   Signed, Gypsy Balsam, NP 12/12/2017 11:23 AM   [facility] [address] [place] [address] [phone] (office) [phone] (fax)

## 2017-12-12 ENCOUNTER — Encounter (INDEPENDENT_AMBULATORY_CARE_PROVIDER_SITE_OTHER): Payer: Self-pay

## 2017-12-12 ENCOUNTER — Encounter: Payer: Self-pay | Admitting: Nurse Practitioner

## 2017-12-12 ENCOUNTER — Ambulatory Visit: Payer: BLUE CROSS/BLUE SHIELD | Admitting: Nurse Practitioner

## 2017-12-12 VITALS — BP 110/68 | HR 70 | Ht 67.0 in | Wt 138.0 lb

## 2017-12-12 DIAGNOSIS — I498 Other specified cardiac arrhythmias: Secondary | ICD-10-CM

## 2017-12-12 DIAGNOSIS — R000 Tachycardia, unspecified: Secondary | ICD-10-CM | POA: Diagnosis not present

## 2017-12-12 DIAGNOSIS — G90A Postural orthostatic tachycardia syndrome (POTS): Secondary | ICD-10-CM

## 2017-12-12 DIAGNOSIS — I951 Orthostatic hypotension: Secondary | ICD-10-CM | POA: Diagnosis not present

## 2017-12-12 MED ORDER — NADOLOL 20 MG PO TABS: 20.0000 mg | ORAL_TABLET | Freq: Every day | ORAL | 3 refills | Status: DC

## 2017-12-12 NOTE — Patient Instructions (Addendum)
Medication Instructions:   Your physician recommends that you continue on your current medications as directed. Please refer to the Current Medication list given to you today.   If you need a refill on your cardiac medications before your next appointment, please call your pharmacy.  Labwork: NONE ORDERED  TODAY    Testing/Procedures: NONE ORDERED  TODAY    Follow-Up: Your physician wants you to follow-up in: ONE YEAR WITH  KLEIN   You will receive a reminder letter in the mail two months in advance. If you don't receive a letter, please call our office to schedule the follow-up appointment.     Any Other Special Instructions Will Be Listed Below (If Applicable).                                                                                                                                                    

## 2017-12-21 ENCOUNTER — Encounter (HOSPITAL_COMMUNITY): Payer: Self-pay | Admitting: *Deleted

## 2017-12-21 ENCOUNTER — Inpatient Hospital Stay (HOSPITAL_COMMUNITY)
Admission: AD | Admit: 2017-12-21 | Discharge: 2017-12-21 | Disposition: A | Discharge status: Home / Self Care | Payer: BLUE CROSS/BLUE SHIELD | Source: Ambulatory Visit | Attending: Obstetrics & Gynecology | Admitting: Obstetrics & Gynecology

## 2017-12-21 DIAGNOSIS — Z87891 Personal history of nicotine dependence: Secondary | ICD-10-CM | POA: Diagnosis not present

## 2017-12-21 DIAGNOSIS — O210 Mild hyperemesis gravidarum: Secondary | ICD-10-CM | POA: Insufficient documentation

## 2017-12-21 DIAGNOSIS — Z3A01 Less than 8 weeks gestation of pregnancy: Secondary | ICD-10-CM | POA: Insufficient documentation

## 2017-12-21 DIAGNOSIS — O219 Vomiting of pregnancy, unspecified: Secondary | ICD-10-CM | POA: Diagnosis not present

## 2017-12-21 LAB — URINALYSIS, ROUTINE W REFLEX MICROSCOPIC
Bilirubin Urine: NEGATIVE
Glucose, UA: NEGATIVE mg/dL
Hgb urine dipstick: NEGATIVE
Ketones, ur: NEGATIVE mg/dL
Leukocytes, UA: NEGATIVE
Nitrite: NEGATIVE
Protein, ur: NEGATIVE mg/dL
Specific Gravity, Urine: 1.02 (ref 1.005–1.030)
pH: 7 (ref 5.0–8.0)

## 2017-12-21 LAB — POCT PREGNANCY, URINE: Preg Test, Ur: POSITIVE — AB

## 2017-12-21 MED ORDER — METOCLOPRAMIDE HCL 10 MG PO TABS: 10.0000 mg | ORAL_TABLET | Freq: Four times a day (QID) | ORAL | 0 refills | Status: DC

## 2017-12-21 NOTE — Discharge Instructions (Signed)
Nausea medication to take during pregnancy:  Unisom (doxylamine succinate 25 mg tablets) Take one tablet daily at bedtime. If symptoms are not adequately controlled, the dose can be increased to a maximum recommended dose of two tablets daily (1/2 tablet in the morning, 1/2 tablet mid-afternoon and one at bedtime). Vitamin B6 100mg  tablets. Take one tablet twice a day (up to 200 mg per day).    Morning Sickness Morning sickness is when you feel sick to your stomach (nauseous) during pregnancy. You may feel sick to your stomach and throw up (vomit). You may feel sick in the morning, but you can feel this way any time of day. Some women feel very sick to their stomach and cannot stop throwing up (hyperemesis gravidarum). Follow these instructions at home:  Only take medicines as told by your doctor.  Take multivitamins as told by your doctor. Taking multivitamins before getting pregnant can stop or lessen the harshness of morning sickness.  Eat dry toast or unsalted crackers before getting out of bed.  Eat 5 to 6 small meals a day.  Eat dry and bland foods like rice and baked potatoes.  Do not drink liquids with meals. Drink between meals.  Do not eat greasy, fatty, or spicy foods.  Have someone cook for you if the smell of food causes you to feel sick or throw up.  If you feel sick to your stomach after taking prenatal vitamins, take them at night or with a snack.  Eat protein when you need a snack (nuts, yogurt, cheese).  Eat unsweetened gelatins for dessert.  Wear a bracelet used for sea sickness (acupressure wristband).  Go to a doctor that puts thin needles into certain body points (acupuncture) to improve how you feel.  Do not smoke.  Use a humidifier to keep the air in your house free of odors.  Get lots of fresh air. Contact a doctor if:  You need medicine to feel better.  You feel dizzy or lightheaded.  You are losing weight. Get help right away if:  You feel  very sick to your stomach and cannot stop throwing up.  You pass out (faint). This information is not intended to replace advice given to you by your health care provider. Make sure you discuss any questions you have with your health care provider. Document Released: 10/19/2004 Document Revised: 02/17/2016 Document Reviewed: 02/26/2013 Elsevier Interactive Patient Education  2017 [employer]

## 2017-12-21 NOTE — MAU Provider Note (Signed)
History     CSN: [id number]  Arrival date and time: 12/21/17 1126   First Provider Initiated Contact with Patient 12/21/17 1234      Chief Complaint  Patient presents with  . Emesis   HPI Carly Reyes is a 27 y.o. G2P1001 at [redacted]w[redacted]d who presents with nausea/vomiting. Reports daily nausea that is worse in the morning. Normally vomits once in the morning. Symptoms typically improve later in the day & she is able to tolerate food. Has not vomited today. Denies fever, diarrhea, abdominal pain, or vaginal bleeding. Has tried ginger pops for her symptoms without relief.   OB History    Gravida  2   Para  1   Term  1   Preterm      AB      Living  1     SAB      TAB      Ectopic      Multiple      Live Births  1           Past Medical History:  Diagnosis Date  . Bipolar 1 disorder (HCC)   . Depression   . GERD (gastroesophageal reflux disease)   . POTS (postural orthostatic tachycardia syndrome) 04/21/2014    Past Surgical History:  Procedure Laterality Date  . TONSILLECTOMY      Family History  Problem Relation Age of Onset  . Hypertension Mother   . Hypertension Father   . Heart failure Father   . Thyroid disease Father   . Graves' disease Father     Social History   Tobacco Use  . Smoking status: Former Games developer  . Smokeless tobacco: Never Used  Substance Use Topics  . Alcohol use: No  . Drug use: Yes    Types: Marijuana    Comment: prior to pregnancy    Allergies: No Known Allergies  Medications Prior to Admission  Medication Sig Dispense Refill Last Dose  . lamoTRIgine (LAMICTAL) 200 MG tablet Take 200 mg by mouth daily.   Taking  . nadolol (CORGARD) 20 MG tablet Take 1 tablet (20 mg total) by mouth daily. Please keep upcoming appt for future refills. Thank you 90 tablet 3   . QUEtiapine (SEROQUEL XR) 300 MG 24 hr tablet Take 300 mg by mouth at bedtime.   Taking  . sertraline (ZOLOFT) 100 MG tablet Take 100 mg by mouth daily.    Taking    Review of Systems  Constitutional: Negative.   Gastrointestinal: Positive for nausea and vomiting (none today). Negative for abdominal pain and diarrhea.  Genitourinary: Negative.    Physical Exam   Blood pressure 123/76, pulse 74, temperature 98.6 F (37 C), temperature source Oral, resp. rate 18, height 5\' 7"  (1.702 m), weight 136 lb (61.7 kg), last menstrual period 11/09/2017, unknown if currently breastfeeding.  Physical Exam  Nursing note and vitals reviewed. Constitutional: She is oriented to person, place, and time. She appears well-developed and well-nourished. No distress.  HENT:  Head: Normocephalic and atraumatic.  Eyes: Conjunctivae are normal. Right eye exhibits no discharge. Left eye exhibits no discharge. No scleral icterus.  Neck: Normal range of motion.  Respiratory: Effort normal. No respiratory distress.  Neurological: She is alert and oriented to person, place, and time.  Skin: She is not diaphoretic.  Psychiatric: She has a normal mood and affect. Her behavior is normal. Judgment and thought content normal.    MAU Course  Procedures Results for orders placed or performed during  the hospital encounter of 12/21/17 (from the past 24 hour(s))  Urinalysis, Routine w reflex microscopic     Status: Abnormal   Collection Time: 12/21/17 11:40 AM  Result Value Ref Range   Color, Urine YELLOW YELLOW   APPearance HAZY (A) CLEAR   Specific Gravity, Urine 1.020 1.005 - 1.030   pH 7.0 5.0 - 8.0   Glucose, UA NEGATIVE NEGATIVE mg/dL   Hgb urine dipstick NEGATIVE NEGATIVE   Bilirubin Urine NEGATIVE NEGATIVE   Ketones, ur NEGATIVE NEGATIVE mg/dL   Protein, ur NEGATIVE NEGATIVE mg/dL   Nitrite NEGATIVE NEGATIVE   Leukocytes, UA NEGATIVE NEGATIVE  Pregnancy, urine POC     Status: Abnormal   Collection Time: 12/21/17 11:50 AM  Result Value Ref Range   Preg Test, Ur POSITIVE (A) NEGATIVE    MDM VSS, NAD No abdominal pain or vaginal bleeding No active  vomiting and u/a is reassuring Will send home with rx for antiemetic  Assessment and Plan  A; 1. Nausea and vomiting during pregnancy prior to [redacted] weeks gestation   2. Less than [redacted] weeks gestation of pregnancy    P: Discharge home Rx reglan Discussed reasons to return to MAU Pt plans [facility]  Judeth Hornrin Addison Whidbee 12/21/2017, 12:34 PM

## 2017-12-21 NOTE — MAU Note (Signed)
Pt had pos HPT last week, also pos UPT @ [facility].  Has been vomiting for the past week, holding down some fluids, mornings are worse.  Denies pain or bleeding.

## 2017-12-29 ENCOUNTER — Inpatient Hospital Stay (HOSPITAL_COMMUNITY)
Admission: AD | Admit: 2017-12-29 | Discharge: 2017-12-30 | Disposition: A | Discharge status: Home / Self Care | Payer: BLUE CROSS/BLUE SHIELD | Source: Ambulatory Visit | Attending: Obstetrics and Gynecology | Admitting: Obstetrics and Gynecology

## 2017-12-29 ENCOUNTER — Inpatient Hospital Stay (HOSPITAL_COMMUNITY): Payer: BLUE CROSS/BLUE SHIELD

## 2017-12-29 ENCOUNTER — Encounter (HOSPITAL_COMMUNITY): Payer: Self-pay | Admitting: *Deleted

## 2017-12-29 DIAGNOSIS — R109 Unspecified abdominal pain: Secondary | ICD-10-CM | POA: Diagnosis not present

## 2017-12-29 DIAGNOSIS — Z3A08 8 weeks gestation of pregnancy: Secondary | ICD-10-CM | POA: Insufficient documentation

## 2017-12-29 DIAGNOSIS — Z3491 Encounter for supervision of normal pregnancy, unspecified, first trimester: Secondary | ICD-10-CM

## 2017-12-29 DIAGNOSIS — O26851 Spotting complicating pregnancy, first trimester: Secondary | ICD-10-CM | POA: Insufficient documentation

## 2017-12-29 DIAGNOSIS — O26891 Other specified pregnancy related conditions, first trimester: Secondary | ICD-10-CM | POA: Diagnosis not present

## 2017-12-29 LAB — URINALYSIS, ROUTINE W REFLEX MICROSCOPIC
Bilirubin Urine: NEGATIVE
Glucose, UA: NEGATIVE mg/dL
Hgb urine dipstick: NEGATIVE
Ketones, ur: NEGATIVE mg/dL
Nitrite: NEGATIVE
Protein, ur: NEGATIVE mg/dL
Specific Gravity, Urine: 1.013 (ref 1.005–1.030)
pH: 5 (ref 5.0–8.0)

## 2017-12-29 LAB — HCG, QUANTITATIVE, PREGNANCY: hCG, Beta Chain, Quant, S: 151483 m[IU]/mL — ABNORMAL HIGH (ref <5)

## 2017-12-29 LAB — CBC
HCT: 32.9 % — ABNORMAL LOW (ref 36.0–46.0)
Hemoglobin: 11.9 g/dL — ABNORMAL LOW (ref 12.0–15.0)
MCH: 30.7 pg (ref 26.0–34.0)
MCHC: 36.2 g/dL — ABNORMAL HIGH (ref 30.0–36.0)
MCV: 84.8 fL (ref 78.0–100.0)
Platelets: 218 10*3/uL (ref 150–400)
RBC: 3.88 MIL/uL (ref 3.87–5.11)
RDW: 12.6 % (ref 11.5–15.5)
WBC: 9.1 10*3/uL (ref 4.0–10.5)

## 2017-12-29 NOTE — MAU Provider Note (Signed)
Chief Complaint: Abdominal Pain and Vaginal Bleeding    First Provider Initiated Contact with Patient 12/30/2017 at 2230   SUBJECTIVE HPI: Carly Reyes is a 27 y.o. G2P1001 at 103w4d who presents to Maternity Admissions reporting: Low abd pain x 1 week and spotting this morning. No other testing this pregnancy. Planning termination.   Vaginal Bleeding: Spotting Passage of tissue or clots: NOne Dizziness: None  B Pos  Pain Location: Low abd, R>L Quality: sharp Severity: 8/10 on pain scale Duration: 1 week Course: worseneing Context: early pregnancy, IUP not confirmed Timing: intermittent Modifying factors: None. Hasn't tried anything Associated signs and symptoms: Pos for nausea, constipation, spotting. Neg for fever, chills, diarrhea, urinary complaints or vaginal discharge.   Past Medical History:  Diagnosis Date  . Bipolar 1 disorder (HCC)   . Depression   . GERD (gastroesophageal reflux disease)   . POTS (postural orthostatic tachycardia syndrome) 04/21/2014   OB History  Gravida Para Term Preterm AB Living  2 1 1     1   SAB TAB Ectopic Multiple Live Births          1    # Outcome Date GA Lbr Len/2nd Weight Sex Delivery Anes PTL Lv  2 Current           1 Term 02/17/14 [redacted]w[redacted]d 28:04 / 00:27 5 lb 12.6 oz (2.625 kg) M Vag-Spont EPI  LIV     Birth Comments: none   Past Surgical History:  Procedure Laterality Date  . TONSILLECTOMY     Social History   Socioeconomic History  . Marital status: Single    Spouse name: Not on file  . Number of children: Not on file  . Years of education: Not on file  . Highest education level: Not on file  Occupational History  . Not on file  Social Needs  . Financial resource strain: Not on file  . Food insecurity:    Worry: Not on file    Inability: Not on file  . Transportation needs:    Medical: Not on file    Non-medical: Not on file  Tobacco Use  . Smoking status: Former Games developer  . Smokeless tobacco: Never Used   Substance and Sexual Activity  . Alcohol use: No  . Drug use: Yes    Types: Marijuana    Comment: prior to pregnancy  . Sexual activity: Yes  Lifestyle  . Physical activity:    Days per week: Not on file    Minutes per session: Not on file  . Stress: Not on file  Relationships  . Social connections:    Talks on phone: Not on file    Gets together: Not on file    Attends religious service: Not on file    Active member of club or organization: Not on file    Attends meetings of clubs or organizations: Not on file    Relationship status: Not on file  . Intimate partner violence:    Fear of current or ex partner: Not on file    Emotionally abused: Not on file    Physically abused: Not on file    Forced sexual activity: Not on file  Other Topics Concern  . Not on file  Social History Narrative  . Not on file   No current facility-administered medications on file prior to encounter.    Current Outpatient Medications on File Prior to Encounter  Medication Sig Dispense Refill  . lamoTRIgine (LAMICTAL) 200 MG tablet Take 200 mg  by mouth daily.    . metoCLOPramide (REGLAN) 10 MG tablet Take 1 tablet (10 mg total) by mouth every 6 (six) hours. 30 tablet 0  . nadolol (CORGARD) 20 MG tablet Take 1 tablet (20 mg total) by mouth daily. Please keep upcoming appt for future refills. Thank you 90 tablet 3  . QUEtiapine (SEROQUEL XR) 300 MG 24 hr tablet Take 300 mg by mouth at bedtime.    . sertraline (ZOLOFT) 100 MG tablet Take 100 mg by mouth daily.     No Known Allergies  I have reviewed the past Medical Hx, Surgical Hx, Social Hx, Allergies and Medications.   Review of Systems  Constitutional: Negative for chills and fever.  Gastrointestinal: Positive for abdominal pain and nausea. Negative for abdominal distention, constipation, diarrhea and vomiting.  Genitourinary: Positive for pelvic pain. Negative for difficulty urinating, dysuria, flank pain, frequency, hematuria, vaginal  bleeding and vaginal discharge.  Neurological: Negative for dizziness.    OBJECTIVE Patient Vitals for the past 24 hrs:  BP Temp Pulse Resp Height Weight  12/30/17 0003 129/74 98.5 F (36.9 C) 75 16 - -  12/29/17 2157 132/79 98.6 F (37 C) 76 18 - -  12/29/17 2155 - - - - 5\' 7"  (1.702 m) 137 lb (62.1 kg)   Constitutional: Well-developed, well-nourished female in no acute distress.  Cardiovascular: normal rate Respiratory: normal rate and effort.  GI: Abd soft, non-tender. Pos BS x 4 MS: Extremities nontender, no edema, normal ROM Neurologic: Alert and oriented x 4.  GU: Neg CVAT.  SPECULUM EXAM: NEFG, physiologic discharge, no blood noted, cervix clean  BIMANUAL: cervix closed; uterus 8-week size, no adnexal tenderness or masses. No CMT.  LAB RESULTS Results for orders placed or performed during the hospital encounter of 12/29/17 (from the past 24 hour(s))  hCG, quantitative, pregnancy     Status: Abnormal   Collection Time: 12/29/17 10:18 PM  Result Value Ref Range   hCG, Beta Chain, Quant, S 151,483 (H) <5 mIU/mL  CBC     Status: Abnormal   Collection Time: 12/29/17 10:18 PM  Result Value Ref Range   WBC 9.1 4.0 - 10.5 K/uL   RBC 3.88 3.87 - 5.11 MIL/uL   Hemoglobin 11.9 (L) 12.0 - 15.0 g/dL   HCT 40.9 (L) 81.1 - 91.4 %   MCV 84.8 78.0 - 100.0 fL   MCH 30.7 26.0 - 34.0 pg   MCHC 36.2 (H) 30.0 - 36.0 g/dL   RDW 78.2 95.6 - 21.3 %   Platelets 218 150 - 400 K/uL  Urinalysis, Routine w reflex microscopic     Status: Abnormal   Collection Time: 12/29/17 10:26 PM  Result Value Ref Range   Color, Urine YELLOW YELLOW   APPearance HAZY (A) CLEAR   Specific Gravity, Urine 1.013 1.005 - 1.030   pH 5.0 5.0 - 8.0   Glucose, UA NEGATIVE NEGATIVE mg/dL   Hgb urine dipstick NEGATIVE NEGATIVE   Bilirubin Urine NEGATIVE NEGATIVE   Ketones, ur NEGATIVE NEGATIVE mg/dL   Protein, ur NEGATIVE NEGATIVE mg/dL   Nitrite NEGATIVE NEGATIVE   Leukocytes, UA SMALL (A) NEGATIVE   RBC / HPF  0-5 0 - 5 RBC/hpf   WBC, UA 0-5 0 - 5 WBC/hpf   Bacteria, UA RARE (A) NONE SEEN   Squamous Epithelial / LPF 6-30 (A) NONE SEEN   Mucus PRESENT     IMAGING US Ob Comp Less 14 Wks  Result Date: 12/29/2017 CLINICAL DATA:  Cramping and spotting. Estimated  gestational age by LMP is 6 weeks 4 days. Quantitative beta HCG is not available. EXAM: OBSTETRIC <14 WK ULTRASOUND TECHNIQUE: Transabdominal ultrasound was performed for evaluation of the gestation as well as the maternal uterus and adnexal regions. COMPARISON:  None. FINDINGS: Intrauterine gestational sac: A single intrauterine pregnancy is identified. Yolk sac:  Visualized. Embryo:  Visualized. Cardiac Activity: Visualized. Heart Rate: 167 bpm CRL: 20 mm   8 w 4 d                  US EDC: 08/06/2018 Subchorionic hemorrhage:  None visualized. Maternal uterus/adnexae: The uterus is retroverted. No myometrial mass lesions are identified. Both ovaries are visualized and appear normal. No abnormal adnexal masses. No abnormal pelvic fluid collections. IMPRESSION: Single intrauterine pregnancy. Estimated gestational age by crown-rump length is 8 weeks 4 days. No acute complication is suggested sonographically. Electronically Signed   By: Burman NievesWilliam  Stevens M.D.   On: 12/29/2017 23:09    MAU COURSE CBC, Quant, ABO/Rh, ultrasound, wet prep and GC/chlamydia culture, UA  MDM Pain and bleeding in early pregnancy with normal intrauterine pregnancy and hemodynamically stable.   ASSESSMENT 1. Normal IUP (intrauterine pregnancy) on prenatal ultrasound, first trimester   2. Abdominal pain during pregnancy in first trimester   3. Spotting affecting pregnancy in first trimester     PLAN Discharge home in stable condition. First trimetser precautions Follow-up Information    [facility]   Why:  as scheduled       [facility] Follow up.   Why:  as needed in pregnancy emergencies Contact  information: [address] [id number] [place] [phone]         Allergies as of 12/30/2017   No Known Allergies     Medication List    TAKE these medications   lamoTRIgine 200 MG tablet Commonly known as:  LAMICTAL Take 200 mg by mouth daily.   metoCLOPramide 10 MG tablet Commonly known as:  REGLAN Take 1 tablet (10 mg total) by mouth every 6 (six) hours.   nadolol 20 MG tablet Commonly known as:  CORGARD Take 1 tablet (20 mg total) by mouth daily. Please keep upcoming appt for future refills. Thank you   QUEtiapine 300 MG 24 hr tablet Commonly known as:  SEROQUEL XR Take 300 mg by mouth at bedtime.   sertraline 100 MG tablet Commonly known as:  ZOLOFT Take 100 mg by mouth daily.        Katrinka BlazingSmith, [place], CNM 12/30/2017  12:19 AM  4

## 2017-12-29 NOTE — MAU Note (Signed)
Having abd pain for few days. Spotting some today. Has appt with [facility] 4/19 for termination but does not know if iup at this point

## 2017-12-30 DIAGNOSIS — O26851 Spotting complicating pregnancy, first trimester: Secondary | ICD-10-CM | POA: Diagnosis not present

## 2017-12-30 DIAGNOSIS — Z3491 Encounter for supervision of normal pregnancy, unspecified, first trimester: Secondary | ICD-10-CM

## 2017-12-30 DIAGNOSIS — R109 Unspecified abdominal pain: Secondary | ICD-10-CM

## 2017-12-30 DIAGNOSIS — O26891 Other specified pregnancy related conditions, first trimester: Secondary | ICD-10-CM | POA: Diagnosis not present

## 2017-12-30 LAB — WET PREP, GENITAL
Sperm: NONE SEEN
Trich, Wet Prep: NONE SEEN
Yeast Wet Prep HPF POC: NONE SEEN

## 2017-12-30 LAB — HIV ANTIBODY (ROUTINE TESTING W REFLEX): HIV Screen 4th Generation wRfx: NONREACTIVE

## 2017-12-30 NOTE — Discharge Instructions (Signed)

## 2017-12-30 NOTE — Progress Notes (Signed)
Ivonne AndrewV. Smith CNM in earlier and discussed test results that are back and d/c plan. Written and verbal d/c instructions given and understanding voiced

## 2017-12-31 LAB — GC/CHLAMYDIA PROBE AMP (~~LOC~~) NOT AT ARMC
Chlamydia: NEGATIVE
Neisseria Gonorrhea: NEGATIVE

## 2018-01-05 ENCOUNTER — Other Ambulatory Visit: Payer: Self-pay

## 2018-01-05 ENCOUNTER — Inpatient Hospital Stay (HOSPITAL_COMMUNITY)
Admission: AD | Admit: 2018-01-05 | Discharge: 2018-01-05 | Disposition: A | Discharge status: Home / Self Care | Payer: BLUE CROSS/BLUE SHIELD | Source: Ambulatory Visit | Attending: Obstetrics & Gynecology | Admitting: Obstetrics & Gynecology

## 2018-01-05 ENCOUNTER — Encounter (HOSPITAL_COMMUNITY): Payer: Self-pay | Admitting: *Deleted

## 2018-01-05 DIAGNOSIS — Z3A09 9 weeks gestation of pregnancy: Secondary | ICD-10-CM | POA: Insufficient documentation

## 2018-01-05 DIAGNOSIS — O210 Mild hyperemesis gravidarum: Secondary | ICD-10-CM | POA: Insufficient documentation

## 2018-01-05 DIAGNOSIS — O219 Vomiting of pregnancy, unspecified: Secondary | ICD-10-CM | POA: Diagnosis not present

## 2018-01-05 DIAGNOSIS — O26891 Other specified pregnancy related conditions, first trimester: Secondary | ICD-10-CM | POA: Diagnosis not present

## 2018-01-05 DIAGNOSIS — Z87891 Personal history of nicotine dependence: Secondary | ICD-10-CM | POA: Diagnosis not present

## 2018-01-05 DIAGNOSIS — R1013 Epigastric pain: Secondary | ICD-10-CM | POA: Diagnosis not present

## 2018-01-05 LAB — URINALYSIS, ROUTINE W REFLEX MICROSCOPIC
Bilirubin Urine: NEGATIVE
Glucose, UA: NEGATIVE mg/dL
Hgb urine dipstick: NEGATIVE
Ketones, ur: NEGATIVE mg/dL
Leukocytes, UA: NEGATIVE
Nitrite: NEGATIVE
Protein, ur: NEGATIVE mg/dL
Specific Gravity, Urine: 1.013 (ref 1.005–1.030)
pH: 6 (ref 5.0–8.0)

## 2018-01-05 MED ORDER — METOCLOPRAMIDE HCL 10 MG PO TABS: 10.0000 mg | ORAL_TABLET | Freq: Three times a day (TID) | ORAL | 1 refills | Status: DC | PRN

## 2018-01-05 NOTE — MAU Provider Note (Signed)
Chief Complaint: Abdominal Pain; Constipation; and Emesis   First Provider Initiated Contact with Patient 01/05/18 1244      SUBJECTIVE HPI: Carly Reyes is a 27 y.o. G2P1001 at [redacted]w[redacted]d by LMP who presents to maternity admissions reporting severe morning sickness, abdominal pain, and constipation. Reports that the morning sickness has been present for a couple of week. She was seen for the same symptoms 2 weeks ago and then again last week. At the initial encounter she was prescribed Reglan, which she states helped eliminate the nausea completely. She reports that she presents to the MAU today because she ran out of her Reglan yesterday and since then she has become severely nauseated. Endorses that she cannot keep food or liquids down, including water. Patient believes the diffuse upper abdominal pain and constipation that she is experiencing is due to the morning sickness because she is unable to stay hydrated or nourished. [facility] and requests enough Reglan to get her through next Friday-Saturday.   She denies vaginal bleeding, vaginal discharge, urinary symptoms, or fever/chills.     HPI  Past Medical History:  Diagnosis Date  . Bipolar 1 disorder (HCC)   . Depression   . POTS (postural orthostatic tachycardia syndrome) 04/21/2014   Past Surgical History:  Procedure Laterality Date  . TONSILLECTOMY     Social History   Socioeconomic History  . Marital status: Married    Spouse name: Not on file  . Number of children: Not on file  . Years of education: Not on file  . Highest education level: Not on file  Occupational History  . Not on file  Social Needs  . Financial resource strain: Not on file  . Food insecurity:    Worry: Not on file    Inability: Not on file  . Transportation needs:    Medical: Not on file    Non-medical: Not on file  Tobacco Use  . Smoking status: Former Games developer  . Smokeless tobacco: Never  Used  Substance and Sexual Activity  . Alcohol use: No  . Drug use: Yes    Types: Marijuana    Comment: occasional  . Sexual activity: Yes  Lifestyle  . Physical activity:    Days per week: Not on file    Minutes per session: Not on file  . Stress: Not on file  Relationships  . Social connections:    Talks on phone: Not on file    Gets together: Not on file    Attends religious service: Not on file    Active member of club or organization: Not on file    Attends meetings of clubs or organizations: Not on file    Relationship status: Not on file  . Intimate partner violence:    Fear of current or ex partner: Not on file    Emotionally abused: Not on file    Physically abused: Not on file    Forced sexual activity: Not on file  Other Topics Concern  . Not on file  Social History Narrative  . Not on file   No current facility-administered medications on file prior to encounter.    Current Outpatient Medications on File Prior to Encounter  Medication Sig Dispense Refill  . lamoTRIgine (LAMICTAL) 200 MG tablet Take 200 mg by mouth daily.    . metoCLOPramide (REGLAN) 10 MG tablet Take 1 tablet (10 mg total) by mouth every 6 (six) hours. 30 tablet 0  .  nadolol (CORGARD) 20 MG tablet Take 1 tablet (20 mg total) by mouth daily. Please keep upcoming appt for future refills. Thank you 90 tablet 3  . Probiotic Product (PROBIOTIC PO) Take 1 capsule by mouth daily.    . QUEtiapine (SEROQUEL XR) 300 MG 24 hr tablet Take 300 mg by mouth at bedtime.    . sertraline (ZOLOFT) 100 MG tablet Take 100 mg by mouth daily.     No Known Allergies  ROS:  Review of Systems  Constitutional: Negative for chills and fever.  Gastrointestinal: Positive for abdominal pain, constipation, nausea and vomiting. Negative for diarrhea.  Genitourinary: Negative for decreased urine volume, difficulty urinating, dysuria, frequency, urgency and vaginal discharge.   I have reviewed patient's Past Medical Hx,  Surgical Hx, Family Hx, Social Hx, medications and allergies.   Physical Exam   Patient Vitals for the past 24 hrs:  BP Temp Temp src Pulse Resp SpO2 Weight  01/05/18 1229 120/76 98.3 F (36.8 C) Oral 72 17 100 % 61.5 kg (135 lb 8 oz)   Constitutional: Well-developed, well-nourished female in no acute distress.  Cardiovascular: normal rate without murmurs, rubs, or gallops Respiratory: normal effort GI: Abd soft, non-tender. Pos BS x 4 Neurologic: Alert and oriented x 4.  PELVIC EXAM: deferred Bimanual exam: deferred   LAB RESULTS Results for orders placed or performed during the hospital encounter of 01/05/18 (from the past 24 hour(s))  Urinalysis, Routine w reflex microscopic     Status: None   Collection Time: 01/05/18 12:32 PM  Result Value Ref Range   Color, Urine YELLOW YELLOW   APPearance CLEAR CLEAR   Specific Gravity, Urine 1.013 1.005 - 1.030   pH 6.0 5.0 - 8.0   Glucose, UA NEGATIVE NEGATIVE mg/dL   Hgb urine dipstick NEGATIVE NEGATIVE   Bilirubin Urine NEGATIVE NEGATIVE   Ketones, ur NEGATIVE NEGATIVE mg/dL   Protein, ur NEGATIVE NEGATIVE mg/dL   Nitrite NEGATIVE NEGATIVE   Leukocytes, UA NEGATIVE NEGATIVE       IMAGING [place] Ob Comp Less 14 Wks  Result Date: 12/29/2017 CLINICAL DATA:  Cramping and spotting. Estimated gestational age by LMP is 6 weeks 4 days. Quantitative beta HCG is not available. EXAM: OBSTETRIC <14 WK ULTRASOUND TECHNIQUE: Transabdominal ultrasound was performed for evaluation of the gestation as well as the maternal uterus and adnexal regions. COMPARISON:  None. FINDINGS: Intrauterine gestational sac: A single intrauterine pregnancy is identified. Yolk sac:  Visualized. Embryo:  Visualized. Cardiac Activity: Visualized. Heart Rate: 167 bpm CRL: 20 mm   8 w 4 d                  US EDC: 08/06/2018 Subchorionic hemorrhage:  None visualized. Maternal uterus/adnexae: The uterus is retroverted. No myometrial mass lesions are identified. Both ovaries are  visualized and appear normal. No abnormal adnexal masses. No abnormal pelvic fluid collections. IMPRESSION: Single intrauterine pregnancy. Estimated gestational age by crown-rump length is 8 weeks 4 days. No acute complication is suggested sonographically. Electronically Signed   By: Burman NievesWilliam  Stevens M.D.   On: 12/29/2017 23:09    MAU Management/MDM: Orders Placed This Encounter  Procedures  . Urinalysis, Routine w reflex microscopic    No orders of the defined types were placed in this encounter.    ASSESSMENT - [redacted] weeks gestation of pregnancy  - Morning sickness during pregnancy in first trimester  - Abdominal pain during pregnancy in first trimester    PLAN - Obtain urinalysis  - Prescribe Reglan -  Recommend docusate or miralax if constipation persists - Discharge home with precautions to return to MAU    [facility], Student-PA 01/05/2018  1:18 PM

## 2018-01-05 NOTE — MAU Note (Signed)
Was here last wk for abd pain, here the wk before morning sickness, was given Reglan, helps. Took last dose last night, would like enough to get through to Friday, [facility]  Having some abd pain, ? Constipation.  Not able to keep down fluids.

## 2018-01-05 NOTE — Discharge Instructions (Signed)

## 2018-01-05 NOTE — MAU Provider Note (Signed)
History     CSN: [id number]  Arrival date and time: 01/05/18 1219   First Provider Initiated Contact with Patient 01/05/18 1244      Chief Complaint  Patient presents with  . Morning Sickness   HPI Carly Reyes is a 27 y.o. G2P1001 at [id number] who presents with nausea & vomiting. Previously seen with same complaint & was prescribed reglan. Reports that she has completed her rx for reglan. States the reglan helped while she was taking it. Since running out of her medication, nausea & vomiting has returned. States unable to keep down food since yesterday. Endorses epigastric pain. Denies fever/chills, vaginal bleeding, or diarrhea. [facility] & is requesting a refill to get her through til her procedure.   OB History    Gravida  2   Para  1   Term  1   Preterm      AB      Living  1     SAB      TAB      Ectopic      Multiple      Live Births  1           Past Medical History:  Diagnosis Date  . Bipolar 1 disorder (HCC)   . Depression   . POTS (postural orthostatic tachycardia syndrome) 04/21/2014    Past Surgical History:  Procedure Laterality Date  . TONSILLECTOMY      Family History  Problem Relation Age of Onset  . Hypertension Mother   . Hypertension Father   . Heart failure Father   . Thyroid disease Father   . Graves' disease Father     Social History   Tobacco Use  . Smoking status: Former Games developermoker  . Smokeless tobacco: Never Used  Substance Use Topics  . Alcohol use: No  . Drug use: Yes    Types: Marijuana    Comment: occasional    Allergies: No Known Allergies  Medications Prior to Admission  Medication Sig Dispense Refill Last Dose  . lamoTRIgine (LAMICTAL) 200 MG tablet Take 200 mg by mouth daily.   01/05/2018 at Unknown time  . metoCLOPramide (REGLAN) 10 MG tablet Take 1 tablet (10 mg total) by mouth every 6 (six) hours. 30 tablet 0 01/04/2018 at Unknown time  . nadolol (CORGARD) 20 MG tablet Take 1  tablet (20 mg total) by mouth daily. Please keep upcoming appt for future refills. Thank you 90 tablet 3 01/05/2018 at Unknown time  . Probiotic Product (PROBIOTIC PO) Take 1 capsule by mouth daily.   01/04/2018 at Unknown time  . QUEtiapine (SEROQUEL XR) 300 MG 24 hr tablet Take 300 mg by mouth at bedtime.   01/04/2018 at Unknown time  . sertraline (ZOLOFT) 100 MG tablet Take 100 mg by mouth daily.   01/05/2018 at Unknown time    Review of Systems  Constitutional: Negative.   Gastrointestinal: Positive for abdominal pain, nausea and vomiting. Negative for constipation and diarrhea.  Genitourinary: Negative.    Physical Exam   Blood pressure 120/76, pulse 72, temperature 98.3 F (36.8 C), temperature source Oral, resp. rate 17, weight 135 lb 8 oz (61.5 kg), last menstrual period 11/09/2017, SpO2 100 %, unknown if currently breastfeeding.  Physical Exam  Nursing note and vitals reviewed. Constitutional: She is oriented to person, place, and time. She appears well-developed and well-nourished. No distress.  HENT:  Head: Normocephalic and atraumatic.  Eyes: Conjunctivae are normal. Right eye exhibits no  discharge. Left eye exhibits no discharge. No scleral icterus.  Neck: Normal range of motion.  Respiratory: Effort normal. No respiratory distress.  GI: Soft. She exhibits no distension. There is no tenderness. There is no rebound and no guarding.  Neurological: She is alert and oriented to person, place, and time.  Skin: Skin is warm and dry. She is not diaphoretic.  Psychiatric: She has a normal mood and affect. Her behavior is normal. Judgment and thought content normal.    MAU Course  Procedures Results for orders placed or performed during the hospital encounter of 01/05/18 (from the past 24 hour(s))  Urinalysis, Routine w reflex microscopic     Status: None   Collection Time: 01/05/18 12:32 PM  Result Value Ref Range   Color, Urine YELLOW YELLOW   APPearance CLEAR CLEAR   Specific  Gravity, Urine 1.013 1.005 - 1.030   pH 6.0 5.0 - 8.0   Glucose, UA NEGATIVE NEGATIVE mg/dL   Hgb urine dipstick NEGATIVE NEGATIVE   Bilirubin Urine NEGATIVE NEGATIVE   Ketones, ur NEGATIVE NEGATIVE mg/dL   Protein, ur NEGATIVE NEGATIVE mg/dL   Nitrite NEGATIVE NEGATIVE   Leukocytes, UA NEGATIVE NEGATIVE    MDM U/a wnl Declined antiemetic in MAU  Assessment and Plan  A: 1. Nausea and vomiting during pregnancy prior to [redacted] weeks gestation   2. [redacted] weeks gestation of pregnancy    P: Discharge home Rx reglan x 1 refill Discussed reasons to return to MAU  Judeth Horn 01/05/2018, 1:17 PM

## 2018-03-30 ENCOUNTER — Encounter (HOSPITAL_COMMUNITY): Payer: Self-pay

## 2018-03-30 ENCOUNTER — Inpatient Hospital Stay (HOSPITAL_COMMUNITY)
Admission: AD | Admit: 2018-03-30 | Discharge: 2018-03-31 | Disposition: A | Discharge status: Home / Self Care | Payer: BLUE CROSS/BLUE SHIELD | Source: Ambulatory Visit | Attending: Obstetrics and Gynecology | Admitting: Obstetrics and Gynecology

## 2018-03-30 ENCOUNTER — Other Ambulatory Visit: Payer: Self-pay

## 2018-03-30 DIAGNOSIS — Z8742 Personal history of other diseases of the female genital tract: Secondary | ICD-10-CM

## 2018-03-30 DIAGNOSIS — Z3202 Encounter for pregnancy test, result negative: Secondary | ICD-10-CM | POA: Insufficient documentation

## 2018-03-30 DIAGNOSIS — Z87891 Personal history of nicotine dependence: Secondary | ICD-10-CM | POA: Insufficient documentation

## 2018-03-30 DIAGNOSIS — N939 Abnormal uterine and vaginal bleeding, unspecified: Secondary | ICD-10-CM | POA: Diagnosis not present

## 2018-03-30 LAB — URINALYSIS, ROUTINE W REFLEX MICROSCOPIC
Bacteria, UA: NONE SEEN
Bilirubin Urine: NEGATIVE
Glucose, UA: NEGATIVE mg/dL
Ketones, ur: NEGATIVE mg/dL
Leukocytes, UA: NEGATIVE
Nitrite: NEGATIVE
Protein, ur: NEGATIVE mg/dL
Specific Gravity, Urine: 1.018 (ref 1.005–1.030)
pH: 5 (ref 5.0–8.0)

## 2018-03-30 LAB — CBC
HCT: 34.8 % — ABNORMAL LOW (ref 36.0–46.0)
Hemoglobin: 12.1 g/dL (ref 12.0–15.0)
MCH: 30.7 pg (ref 26.0–34.0)
MCHC: 34.8 g/dL (ref 30.0–36.0)
MCV: 88.3 fL (ref 78.0–100.0)
Platelets: 223 10*3/uL (ref 150–400)
RBC: 3.94 MIL/uL (ref 3.87–5.11)
RDW: 12.5 % (ref 11.5–15.5)
WBC: 6.8 10*3/uL (ref 4.0–10.5)

## 2018-03-30 LAB — HCG, QUANTITATIVE, PREGNANCY: hCG, Beta Chain, Quant, S: 1 m[IU]/mL (ref <5)

## 2018-03-30 LAB — POCT PREGNANCY, URINE: Preg Test, Ur: NEGATIVE

## 2018-03-30 NOTE — MAU Note (Signed)
I [facility]. Have never stopped bleeding since procedure. Sometimes bleeding heavier and sometimes lighter. Yesterday was really heavy and passed ? Tissue. Today still having cramps and have used several tampons and pads today.

## 2018-03-30 NOTE — MAU Note (Signed)
Pt. Reports to MAU with bleeding since 01/11/18 after TAB. Pt. States she had a tampon in for 1 hr and then had bleeding dripping down her leg. States she saw medium sized tissue come out after removing tampon. States today she is bleeding like period. Reports LMP 2 weeks ago. Reports cramping rated at 3/10.

## 2018-03-30 NOTE — MAU Provider Note (Signed)
History     CSN: [id number]  Arrival date and time: 03/30/18 2230   First Provider Initiated Contact with Patient 03/30/18 2336      Chief Complaint  Patient presents with  . Vaginal Bleeding   HPI   Ms.Carly Reyes is a 27 y.o. female G2P1011 unknown pregnancy,  here in MAU with vaginal bleeding. [facility] Says she never went back for F/u. Since the procedure she has been bleeding everyday. Says it stopped for one day, however then continued. The bleeding has waxed and waned; some light days some heavy days. The bleeding is bright red. She has had unprotected sex since the procedure. + lower abdominal cramping; the pain comes and goes. She rates her pain 3/10. Patient is married, 1 sexual partner.   OB History    Gravida  2   Para  1   Term  1   Preterm      AB  1   Living  1     SAB      TAB  1   Ectopic      Multiple      Live Births  1           Past Medical History:  Diagnosis Date  . Bipolar 1 disorder (HCC)   . Depression   . POTS (postural orthostatic tachycardia syndrome) 04/21/2014    Past Surgical History:  Procedure Laterality Date  . TONSILLECTOMY      Family History  Problem Relation Age of Onset  . Hypertension Mother   . Hypertension Father   . Heart failure Father   . Thyroid disease Father   . Graves' disease Father     Social History   Tobacco Use  . Smoking status: Former Games developer  . Smokeless tobacco: Never Used  Substance Use Topics  . Alcohol use: Yes    Alcohol/week: 0.6 oz    Types: 1 Shots of liquor per week    Comment: 03/30/18  . Drug use: Yes    Types: Marijuana    Comment: last use 03/30/18    Allergies: No Known Allergies  Medications Prior to Admission  Medication Sig Dispense Refill Last Dose  . lamoTRIgine (LAMICTAL) 200 MG tablet Take 200 mg by mouth daily.   03/30/2018 at Unknown time  . nadolol (CORGARD) 20 MG tablet Take 1 tablet (20 mg total) by  mouth daily. Please keep upcoming appt for future refills. Thank you 90 tablet 3 03/30/2018 at Unknown time  . QUEtiapine (SEROQUEL XR) 300 MG 24 hr tablet Take 300 mg by mouth at bedtime.   03/29/2018 at Unknown time  . sertraline (ZOLOFT) 100 MG tablet Take 100 mg by mouth daily.   03/30/2018 at Unknown time  . metoCLOPramide (REGLAN) 10 MG tablet Take 1 tablet (10 mg total) by mouth every 8 (eight) hours as needed for nausea. 30 tablet 1 More than a month at Unknown time  . Probiotic Product (PROBIOTIC PO) Take 1 capsule by mouth daily.   More than a month at Unknown time   Results for orders placed or performed during the hospital encounter of 03/30/18 (from the past 48 hour(s))  Urinalysis, Routine w reflex microscopic     Status: Abnormal   Collection Time: 03/30/18 10:51 PM  Result Value Ref Range   Color, Urine YELLOW YELLOW   APPearance CLEAR CLEAR   Specific Gravity, Urine 1.018 1.005 - 1.030   pH 5.0 5.0 -  8.0   Glucose, UA NEGATIVE NEGATIVE mg/dL   Hgb urine dipstick MODERATE (A) NEGATIVE   Bilirubin Urine NEGATIVE NEGATIVE   Ketones, ur NEGATIVE NEGATIVE mg/dL   Protein, ur NEGATIVE NEGATIVE mg/dL   Nitrite NEGATIVE NEGATIVE   Leukocytes, UA NEGATIVE NEGATIVE   RBC / HPF 0-5 0 - 5 RBC/hpf   WBC, UA 0-5 0 - 5 WBC/hpf   Bacteria, UA NONE SEEN NONE SEEN   Squamous Epithelial / LPF 0-5 0 - 5   Mucus PRESENT     Comment: Performed at [facility], [address] [place], [place] [address]  Pregnancy, urine POC     Status: None   Collection Time: 03/30/18 10:57 PM  Result Value Ref Range   Preg Test, Ur NEGATIVE NEGATIVE    Comment:        THE SENSITIVITY OF THIS METHODOLOGY IS >24 mIU/mL   hCG, quantitative, pregnancy     Status: None   Collection Time: 03/30/18 11:20 PM  Result Value Ref Range   hCG, Beta Chain, Quant, S 1 <5 mIU/mL    Comment:          GEST. AGE      CONC.  (mIU/mL)   <=1 WEEK        5 - 50     2 WEEKS       50 - 500     3 WEEKS       100 -  10,000     4 WEEKS     1,000 - 30,000     5 WEEKS     3,500 - 115,000   6-8 WEEKS     12,000 - 270,000    12 WEEKS     15,000 - 220,000        FEMALE AND NON-PREGNANT FEMALE:     LESS THAN 5 mIU/mL Performed at [facility], [address], [place], [place] [address]   CBC     Status: Abnormal   Collection Time: 03/30/18 11:20 PM  Result Value Ref Range   WBC 6.8 4.0 - 10.5 K/uL   RBC 3.94 3.87 - 5.11 MIL/uL   Hemoglobin 12.1 12.0 - 15.0 g/dL   HCT 66.4 (L) 40.3 - 47.4 %   MCV 88.3 78.0 - 100.0 fL   MCH 30.7 26.0 - 34.0 pg   MCHC 34.8 30.0 - 36.0 g/dL   RDW 25.9 56.3 - 87.5 %   Platelets 223 150 - 400 K/uL    Comment: Performed at [facility], [address], [place], [place] [address]   Review of Systems  Constitutional: Negative for fever.  Gastrointestinal: Positive for abdominal pain.  Genitourinary: Positive for vaginal bleeding.  Neurological: Negative for dizziness.   Physical Exam   Blood pressure 123/70, pulse 80, temperature 98 F (36.7 C), resp. rate 16, height 5\' 7"  (1.702 m), weight 126 lb (57.2 kg), last menstrual period 03/16/2018, unknown if currently breastfeeding.  Physical Exam  Constitutional: She is oriented to person, place, and time. She appears well-developed and well-nourished. No distress.  Respiratory: Effort normal.  Genitourinary:  Genitourinary Comments: Vagina - Small amount of dark red blood in the vaginal canal,  no odor  Cervix - No contact bleeding, scant active bleeding  Bimanual exam: Cervix closed Uterus non tender, normal size Adnexa non tender, no masses bilaterally GC/Chlam off urine  Chaperone present for exam.   Musculoskeletal: Normal range of motion.  Neurological: She is alert and oriented to person, place, and  time.  Skin: Skin is warm. She is not diaphoretic.  Psychiatric: Her behavior is normal.   MAU Course  Procedures  None  MDM  Urine pregnancy test negative Quant negative CBC stable GC off urine    Assessment and Plan   A:  1. Abnormal vaginal bleeding   2. History of termination of pregnancy     P:  Discharge home in stable condition Return to MAU for emergencies Rx: provera  Discussed importance of regular GYN care Condoms always Bleeding precautions  Rasch, Harolyn RutherfordJennifer I, NP 03/31/2018 12:10 AM

## 2018-03-31 MED ORDER — MEDROXYPROGESTERONE ACETATE 10 MG PO TABS
10.0000 mg | ORAL_TABLET | Freq: Every day | ORAL | 0 refills | Status: DC
Start: 2018-03-31 — End: 2019-12-22

## 2018-03-31 NOTE — Discharge Instructions (Signed)

## 2018-03-31 NOTE — Progress Notes (Signed)
Jennifer Rasch NP in earlier to discuss test results and d/c plan. Written and verbal d/c instructions given and understanding voiced. 

## 2018-12-02 ENCOUNTER — Other Ambulatory Visit: Payer: Self-pay

## 2018-12-02 MED ORDER — NADOLOL 20 MG PO TABS: 20.0000 mg | ORAL_TABLET | Freq: Every day | ORAL | 3 refills | Status: DC

## 2019-10-01 ENCOUNTER — Ambulatory Visit: Payer: BLUE CROSS/BLUE SHIELD | Admitting: Internal Medicine

## 2019-11-18 ENCOUNTER — Other Ambulatory Visit: Payer: Self-pay | Admitting: Internal Medicine

## 2019-11-18 MED ORDER — NADOLOL 20 MG PO TABS: 20.0000 mg | ORAL_TABLET | Freq: Every day | ORAL | 0 refills | Status: DC

## 2019-11-18 NOTE — Telephone Encounter (Signed)
*  STAT* If patient is at the pharmacy, call can be transferred to refill team.   1. Which medications need to be refilled? (please list name of each medication and dose if known) nadolol (CORGARD) 20 MG tablet  2. Which pharmacy/location (including street and city if local pharmacy) is medication to be sent to? [facility] - [place] ([place]), [place] - [address]  3. Do they need a 30 day or 90 day supply? 90 day supply  Patient has scheduled an appointment for 12/22/19 at 8:00 AM wit Dr. Graciela Husbands.

## 2019-12-22 ENCOUNTER — Ambulatory Visit: Payer: Self-pay | Admitting: Internal Medicine

## 2019-12-22 ENCOUNTER — Other Ambulatory Visit: Payer: Self-pay

## 2019-12-22 ENCOUNTER — Encounter: Payer: Self-pay | Admitting: Internal Medicine

## 2019-12-22 VITALS — BP 132/82 | HR 68 | Ht 67.0 in | Wt 135.6 lb

## 2019-12-22 DIAGNOSIS — Z8616 Personal history of COVID-19: Secondary | ICD-10-CM

## 2019-12-22 DIAGNOSIS — G90A Postural orthostatic tachycardia syndrome (POTS): Secondary | ICD-10-CM

## 2019-12-22 DIAGNOSIS — I498 Other specified cardiac arrhythmias: Secondary | ICD-10-CM

## 2019-12-22 DIAGNOSIS — Z79899 Other long term (current) drug therapy: Secondary | ICD-10-CM

## 2019-12-22 NOTE — Progress Notes (Signed)
Patient Care Team: Patient, No Pcp Per as PCP - General (General Practice)   HPI  Carly Reyes is a 29 y.o. female Seen in followup for symptoms most consistent with POTS.   She also has anxiety and bipolar disorder these are relatively well compensated  Had been doing great from [facility] and dizziness.  Got COVID 11/20 and has been getting better, but in 2/21 started with fatigue reminiscient of her presenting symptoms 2015 and they have been attenuated by aggressive hydration; fatigue is better ?? At the end of the day.  Curiously this has occurred concomitantly with arthlagias, severe enough to awaken her at night with crying Sleeping well   Menses w normal bleeding but occurring every 2-3 weeks     Her son is 6 "Carly Reyes"    Past Medical History:  Diagnosis Date  . Bipolar 1 disorder ([place])   . Depression   . POTS (postural orthostatic tachycardia syndrome) 04/21/2014    Past Surgical History:  Procedure Laterality Date  . TONSILLECTOMY      Current Outpatient Medications  Medication Sig Dispense Refill  . lamoTRIgine (LAMICTAL) 200 MG tablet Take 200 mg by mouth daily.    . nadolol (CORGARD) 20 MG tablet Take 1 tablet (20 mg total) by mouth daily. Please keep upcoming appt for future refills. Thank you 90 tablet 0  . Probiotic Product (PROBIOTIC PO) Take 1 capsule by mouth daily.    . QUEtiapine (SEROQUEL XR) 300 MG 24 hr tablet Take 300 mg by mouth at bedtime.    . sertraline (ZOLOFT) 100 MG tablet Take 100 mg by mouth daily.     No current facility-administered medications for this visit.    No Known Allergies  Review of Systems negative except from HPI and PMH  Physical Exam BP 132/82   Pulse 68   Ht _0  (1.702 m)   Wt 135 lb 9.6 oz (61.5 kg)   SpO2 97%   BMI 21.24 kg/m  Well developed and well nourished in no acute distress HENT normal E scleral and icterus clear Neck Supple JVP flat; carotids brisk and full Clear to ausculation  Regular  rate and rhythm, no murmurs gallops or rub Soft with active bowel sounds No clubbing cyanosis no Edema Alert and oriented, grossly normal motor and sensory function Skin Warm and Dry  ECGsinus @ 68 09/02/40 Axis 93  Assessment and  Plan  POTS  Bipolar/anxiety  Fatigue  Arthralgias  COVID 11/20  Cigarette REabuse        POTS  Not evident on orthostatics today, and LH and palps quiescent  Fatigue may be post COVID, seen in even mild disease, but I dont see it occurring late ( UptoDate); arthralgias are similar, so I wonder, despite sir Gwyndolyn Saxon of [phone], whether there is not a second issue ongoing.  Will check ESR, CRP and ANA and CBC  Post COVID POTS had been mentioned in the past, although I could not find a literature a few months ago,  Will have to look again   May need referral if they are abnormal or her symptoms persist  Encouraged her again to stop smoking  There is some literature about post COVID (Long COVID) fatigue, and ":A significant dissociation over time   between post-COVID-19 participants with fatigue and control participants was observed (p=[id number])"  Dysautonomia in post-COVID-19 patients: new insightsNicolas Barizien1 (not yet peer reviewed)   I have not found a literature on long covid arthralgia/arthritis

## 2019-12-22 NOTE — Patient Instructions (Signed)
Medication Instructions:  Your physician recommends that you continue on your current medications as directed. Please refer to the Current Medication list given to you today.  *If you need a refill on your cardiac medications before your next appointment, please call your pharmacy*   Lab Work: You will have labs drawn today: CBC, CRP/ESR, ANA  If you have labs (blood work) drawn today and your tests are completely normal, you will receive your results only by: Marland Kitchen MyChart Message (if you have MyChart) OR . A paper copy in the mail If you have any lab test that is abnormal or we need to change your treatment, we will call you to review the results.   Testing/Procedures: None ordered.    Follow-Up: At [facility], you and your health needs are our priority.  As part of our continuing mission to provide you with exceptional heart care, we have created designated Provider Care Teams.  These Care Teams include your primary Cardiologist (physician) and Advanced Practice Providers (APPs -  Physician Assistants and Nurse Practitioners) who all work together to provide you with the care you need, when you need it.  We recommend signing up for the patient portal called "MyChart".  Sign up information is provided on this After Visit Summary.  MyChart is used to connect with patients for Virtual Visits (Telemedicine).  Patients are able to view lab/test results, encounter notes, upcoming appointments, etc.  Non-urgent messages can be sent to your provider as well.   To learn more about what you can do with MyChart, go to [email or web address].    Your next appointment:   12 months with Dr Caryl Comes  The format for your next appointment:   In person  Provider:   You may see Dr Caryl Comes or one of the following Advanced Practice Providers on your designated Care Team:    Chanetta Marshall, NP  Tommye Standard, PA-C  Legrand Como "[place]" [place], [place]

## 2019-12-23 LAB — CBC
Hematocrit: 37.2 % (ref 34.0–46.6)
Hemoglobin: 12.2 g/dL (ref 11.1–15.9)
MCH: 30 pg (ref 26.6–33.0)
MCHC: 32.8 g/dL (ref 31.5–35.7)
MCV: 91 fL (ref 79–97)
Platelets: 233 10*3/uL (ref 150–450)
RBC: 4.07 x10E6/uL (ref 3.77–5.28)
RDW: 12.6 % (ref 11.7–15.4)
WBC: 6.7 10*3/uL (ref 3.4–10.8)

## 2019-12-23 LAB — SEDIMENTATION RATE: Sed Rate: 2 mm/hr (ref 0–32)

## 2019-12-23 LAB — C-REACTIVE PROTEIN: CRP: 1 mg/L (ref 0–10)

## 2019-12-23 LAB — ANA: Anti Nuclear Antibody (ANA): NEGATIVE

## 2020-01-02 IMAGING — US US OB COMP LESS 14 WK
1 series · 15 of 18 positions shown · non-contrast
Comparison: None.

CLINICAL DATA: Cramping and spotting. Estimated gestational age by
LMP is 6 weeks 4 days. Quantitative beta HCG is not available.

EXAM:
OBSTETRIC <14 WK ULTRASOUND
TECHNIQUE: Transabdominal ultrasound was performed for evaluation of the
gestation as well as the maternal uterus and adnexal regions.

[Series 1: us ob comp less 14 wk · 18 acquisitions, 15 frames shown]
[im 1/18]
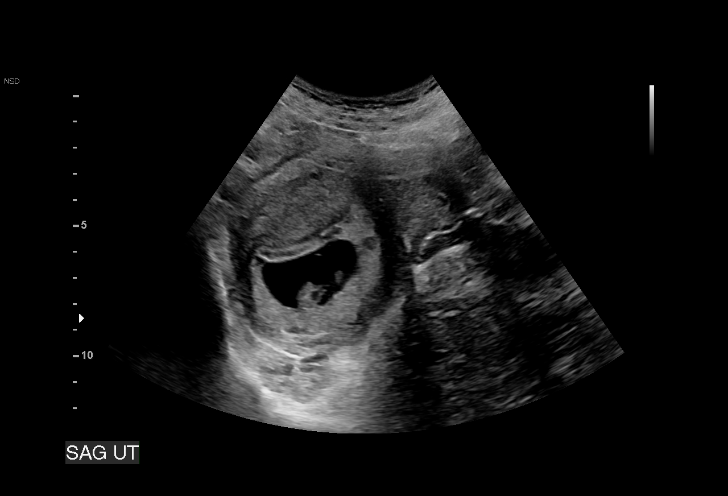
[im 2/18]
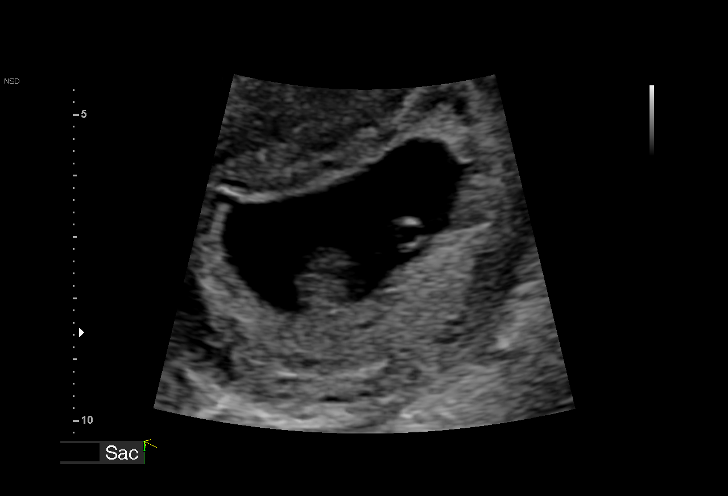
[im 4/18]
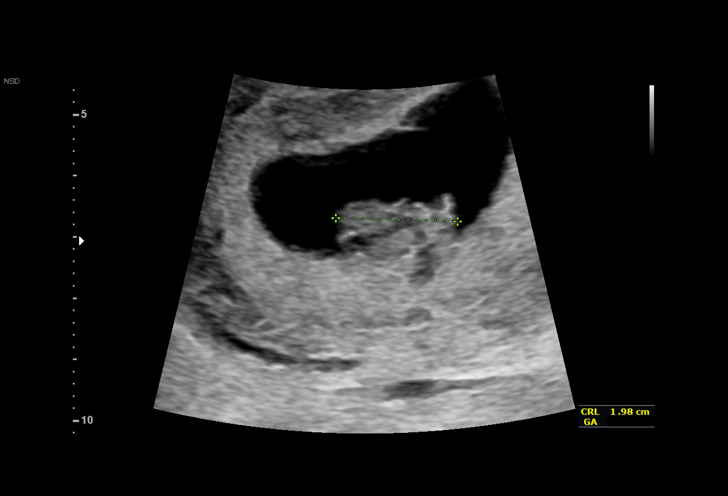
[im 5/18]
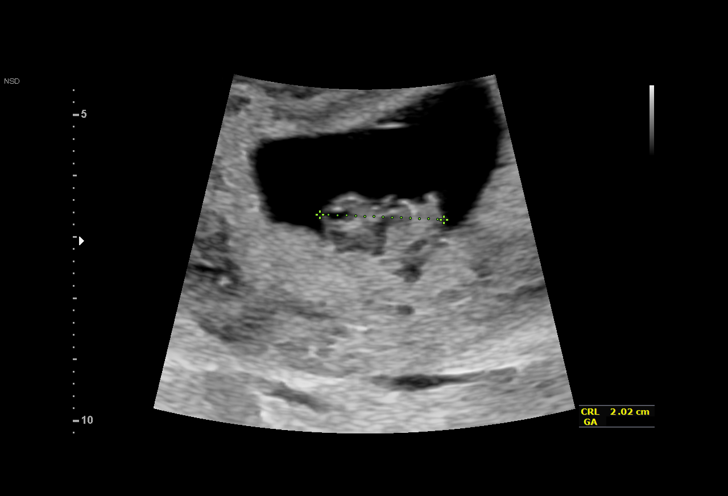
[im 6/18]
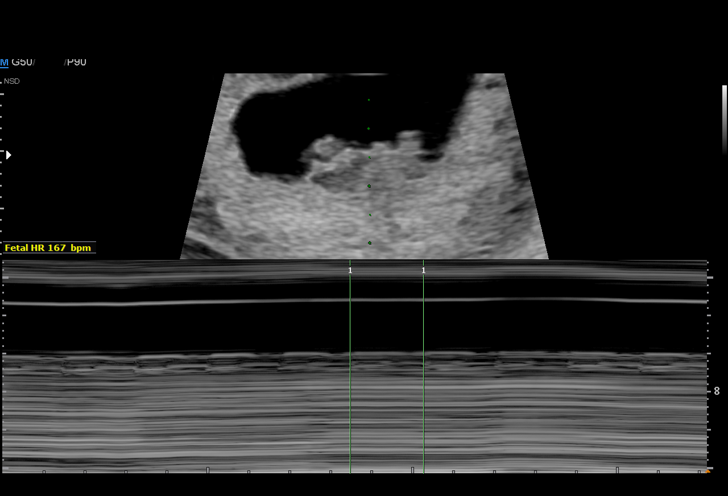
[im 7/18]
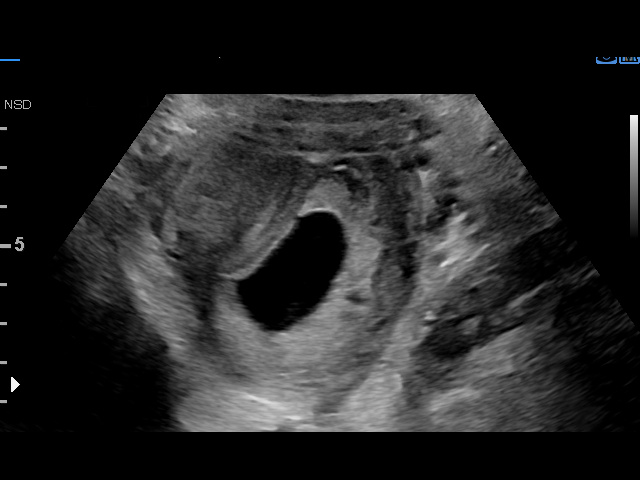
[im 8/18]
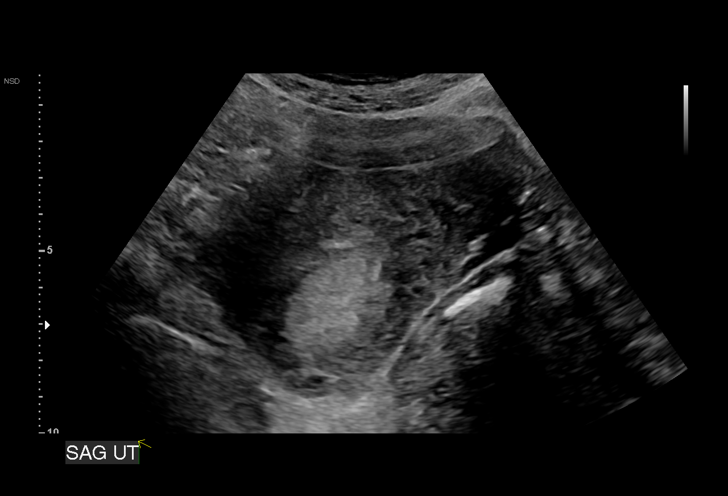
[im 10/18]
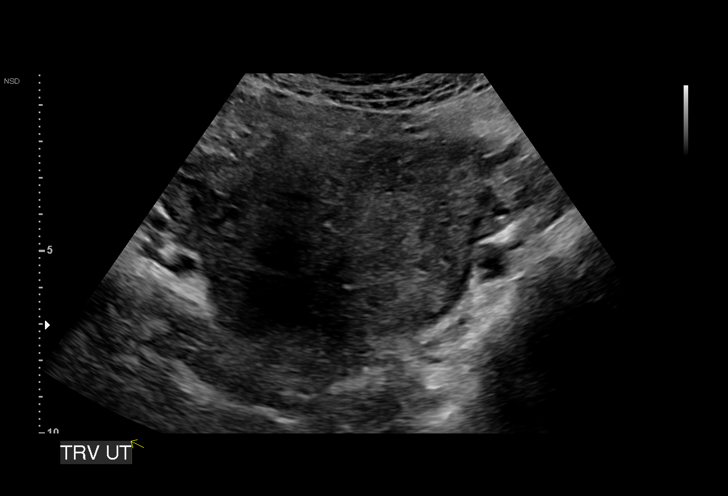
[im 11/18]
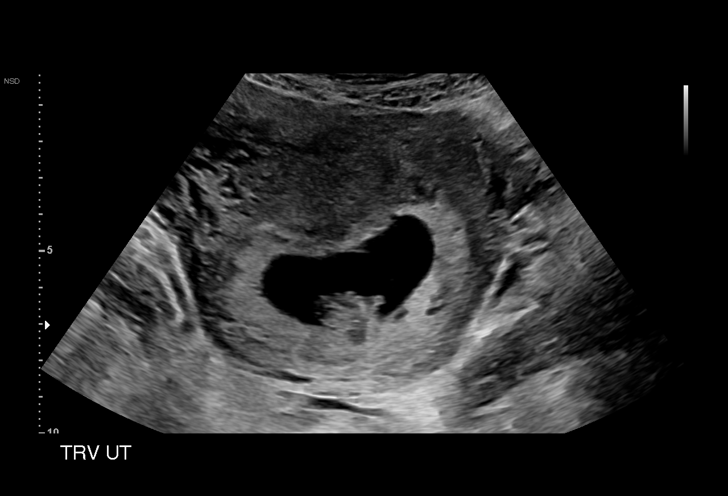
[im 12/18]
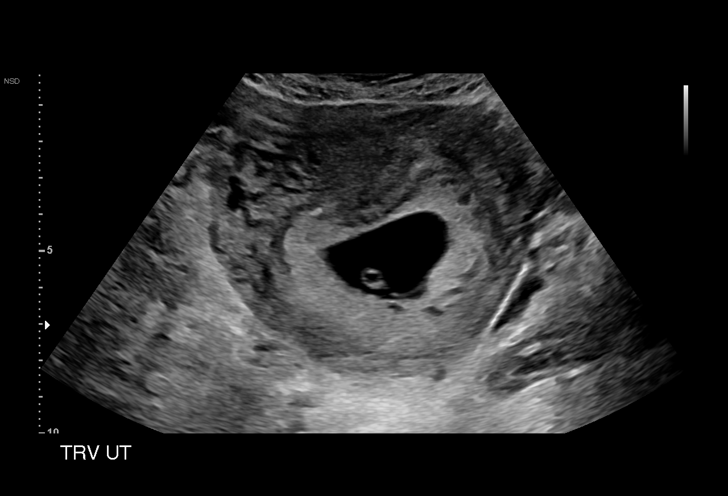
[im 13/18]
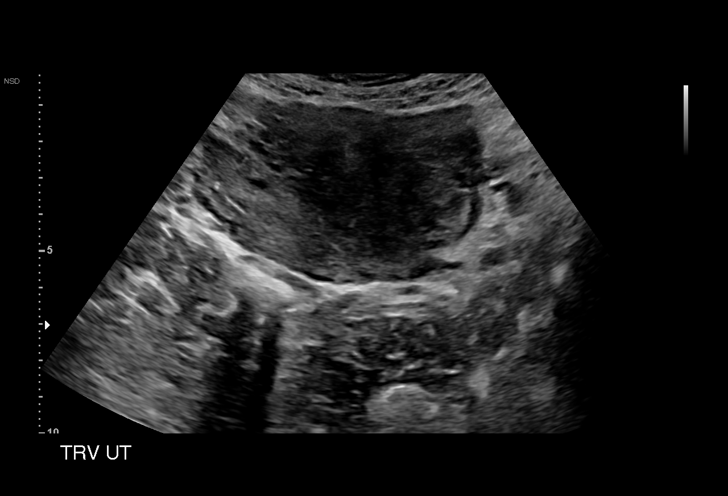
[im 14/18]
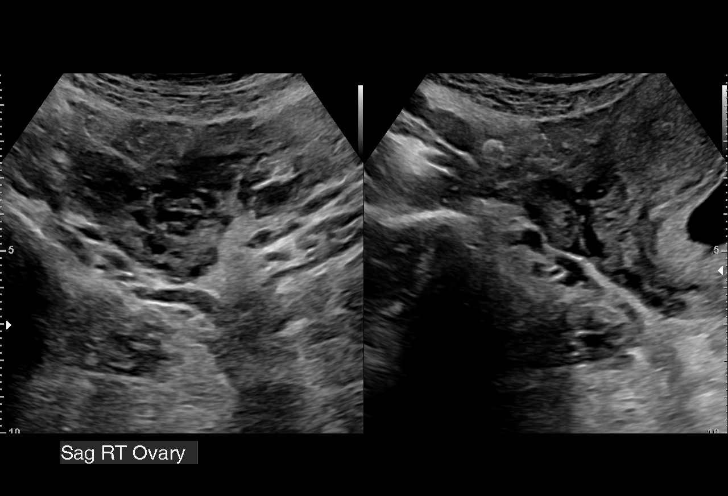
[im 16/18]
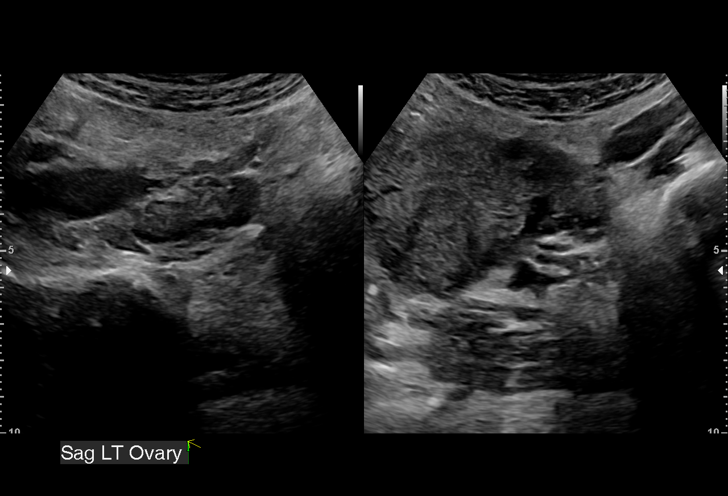
[im 17/18]
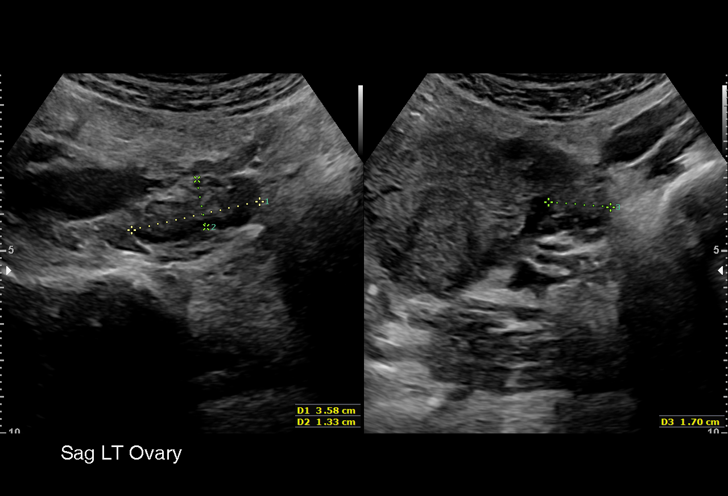
[im 18/18]
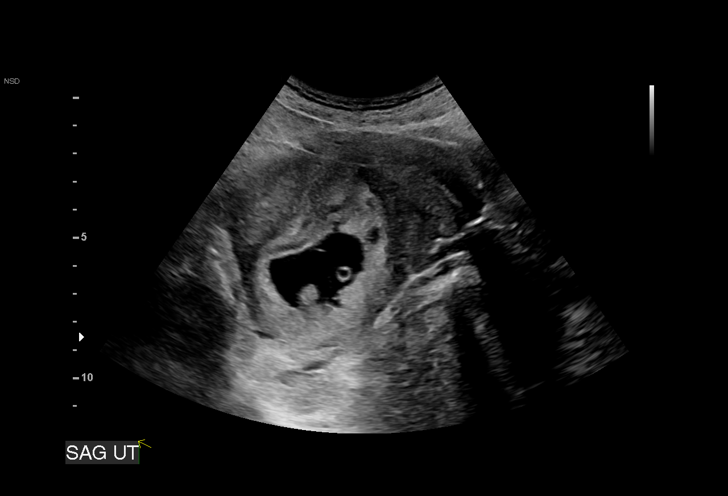

[15 of 18 positions shown; findings below may reference images not displayed]

FINDINGS: Intrauterine gestational sac: A single intrauterine pregnancy is
identified.

Yolk sac:  Visualized.

Embryo:  Visualized.

Cardiac Activity: Visualized.

Heart Rate: 167 bpm

CRL: 20 mm   8 w 4 d                  US EDC: 08/06/2018

Subchorionic hemorrhage:  None visualized.

Maternal uterus/adnexae: The uterus is retroverted. No myometrial
mass lesions are identified. Both ovaries are visualized and appear
normal. No abnormal adnexal masses. No abnormal pelvic fluid
collections.
IMPRESSION: Single intrauterine pregnancy. Estimated gestational age by
crown-rump length is 8 weeks 4 days. No acute complication is
suggested sonographically.

## 2020-02-22 ENCOUNTER — Other Ambulatory Visit: Payer: Self-pay | Admitting: Internal Medicine

## 2020-02-24 ENCOUNTER — Other Ambulatory Visit: Payer: Self-pay

## 2020-02-24 MED ORDER — NADOLOL 20 MG PO TABS: 20.0000 mg | ORAL_TABLET | Freq: Every day | ORAL | 2 refills | Status: DC

## 2020-02-24 NOTE — Telephone Encounter (Signed)
Pt's medication was sent to pt's pharmacy as requested. Confirmation received.  °

## 2020-11-01 ENCOUNTER — Telehealth: Payer: Self-pay | Admitting: Internal Medicine

## 2020-11-01 MED ORDER — NADOLOL 20 MG PO TABS: 20.0000 mg | ORAL_TABLET | Freq: Every day | ORAL | 0 refills | Status: DC

## 2020-11-01 NOTE — Telephone Encounter (Signed)
Pt's medication was sent to pt's pharmacy as requested. Confirmation received.  °

## 2020-11-01 NOTE — Telephone Encounter (Signed)
*  STAT* If patient is at the pharmacy, call can be transferred to refill team.   1. Which medications need to be refilled? (please list name of each medication and dose if known) nadolol (CORGARD) 20 MG tablet   2. Which pharmacy/location (including street and city if local pharmacy) is medication to be sent to?  [facility] - [place] ([place]), [place] - [address]  3. Do they need a 30 day or 90 day supply? 90  Patient has an appointment schedule with Dr. Graciela Husbands 02/09/2021. Please advise.

## 2020-12-01 ENCOUNTER — Encounter: Payer: Self-pay | Admitting: Nurse Practitioner

## 2020-12-02 ENCOUNTER — Other Ambulatory Visit: Payer: Self-pay

## 2020-12-02 ENCOUNTER — Encounter: Payer: Medicaid Other | Admitting: Nurse Practitioner

## 2020-12-09 ENCOUNTER — Encounter: Payer: Medicaid Other | Admitting: Nurse Practitioner

## 2020-12-09 ENCOUNTER — Encounter: Payer: Self-pay | Admitting: Nurse Practitioner

## 2020-12-09 NOTE — Telephone Encounter (Signed)
Left message for patient to call and schedule appointment.

## 2021-01-31 ENCOUNTER — Other Ambulatory Visit: Payer: Self-pay

## 2021-01-31 MED ORDER — NADOLOL 20 MG PO TABS: 20.0000 mg | ORAL_TABLET | Freq: Every day | ORAL | 0 refills | Status: DC

## 2021-01-31 NOTE — Telephone Encounter (Signed)
Pt's medication was sent to pt's pharmacy as requested. Confirmation received.  °

## 2021-02-09 ENCOUNTER — Other Ambulatory Visit: Payer: Self-pay

## 2021-02-09 ENCOUNTER — Encounter: Payer: Self-pay | Admitting: Internal Medicine

## 2021-02-09 ENCOUNTER — Ambulatory Visit (INDEPENDENT_AMBULATORY_CARE_PROVIDER_SITE_OTHER): Payer: 59 | Admitting: Internal Medicine

## 2021-02-09 VITALS — BP 124/82 | HR 68 | Ht 67.0 in | Wt 127.0 lb

## 2021-02-09 DIAGNOSIS — I498 Other specified cardiac arrhythmias: Secondary | ICD-10-CM

## 2021-02-09 DIAGNOSIS — G90A Postural orthostatic tachycardia syndrome (POTS): Secondary | ICD-10-CM

## 2021-02-09 MED ORDER — NADOLOL 20 MG PO TABS: 20.0000 mg | ORAL_TABLET | Freq: Every day | ORAL | 3 refills | Status: DC

## 2021-02-09 NOTE — Progress Notes (Signed)
      Patient Care Team: Patient, No Pcp Per (Inactive) as PCP - General (General Practice)   HPI  Carly Reyes is a 30 y.o. female Seen in followup for symptoms most consistent with POTS ; see a full so marginal her full service.  Also has anxiety and bipolar disorder these are relatively well compensated The patient denies chest pain, shortness of breath, nocturnal dyspnea, orthopnea or peripheral edema.  There have been no palpitations or syncope.  Some lightheadedness.  Some heat intolerance.  Is exercising has some post exercise lightheadedness but has not fainted now in more than a year.  Has stopped smoking.  No alcohol.  Just taking a new job.  Mental status is doing terrific. Her son is 6 "Carly Reyes"    Past Medical History:  Diagnosis Date  . Bipolar 1 disorder (HCC)   . Depression   . POTS (postural orthostatic tachycardia syndrome) 04/21/2014    Past Surgical History:  Procedure Laterality Date  . TONSILLECTOMY      Current Outpatient Medications  Medication Sig Dispense Refill  . lamoTRIgine (LAMICTAL) 200 MG tablet Take 200 mg by mouth daily.    . nadolol (CORGARD) 20 MG tablet Take 1 tablet (20 mg total) by mouth daily. Please keep upcoming appt in May 2022 with Dr. Graciela Husbands before anymore refills. Thank you Final Attempt 30 tablet 0  . QUEtiapine (SEROQUEL XR) 300 MG 24 hr tablet Take 300 mg by mouth at bedtime.    . sertraline (ZOLOFT) 100 MG tablet Take 100 mg by mouth daily.     No current facility-administered medications for this visit.    No Known Allergies  Review of Systems negative except from HPI and PMH  Physical Exam BP 124/82   Pulse 68   Ht 5\' 7"  (1.702 m)   Wt 127 lb (57.6 kg)   SpO2 98%   BMI 19.89 kg/m  Well developed and nourished in no acute distress HENT normal Neck supple with JVP-  flat   Clear Regular rate and rhythm, no murmurs or gallops Abd-soft with active BS No Clubbing cyanosis edema Skin-warm and dry A &  Oriented  Grossly normal sensory and motor function  ECG sinus at 68 Intervals 09/01/1939  Assessment and  Plan  POTS  Bipolar/anxiety  Fatigue     Overall she is doing exceedingly well.  Orthostatics were normal today.  Reviewed the importance of hydration salt and exercise.  She has stopped smoking.  HOORAY   We will refill her nadolol for her palpitations.  20 mg daily.

## 2021-02-09 NOTE — Patient Instructions (Signed)

## 2021-04-03 ENCOUNTER — Ambulatory Visit
Admission: EM | Admit: 2021-04-03 | Discharge: 2021-04-03 | Disposition: A | Discharge status: Home / Self Care | Payer: 59 | Attending: Student | Admitting: Student

## 2021-04-03 ENCOUNTER — Other Ambulatory Visit: Payer: Self-pay

## 2021-04-03 DIAGNOSIS — R509 Fever, unspecified: Secondary | ICD-10-CM

## 2021-04-03 DIAGNOSIS — Z1152 Encounter for screening for COVID-19: Secondary | ICD-10-CM

## 2021-04-03 NOTE — Discharge Instructions (Addendum)
-  For fevers/chills, bodyaches, headaches- Take Tylenol 1000 mg 3 times daily, and ibuprofen 800 mg 3 times daily with food.  You can take these together, or alternate every 3-4 hours. -Your covid and influenza tests should come back in about 3 days -With a virus, you're typically contagious for 5-7 days, or as long as you're having fevers. This is also how long you'll feel sick. -Come back and see [place] if new symptoms like shortness of breath, weakness, dizziness, ear pain

## 2021-04-03 NOTE — ED Triage Notes (Signed)
Onset yesterday morning of chills, body aches, HA and fever. Last dose of tylenol taken at 10:30a with some relief. Tmax 104. Denies cough and sore throat. No abdominal pain, n/v/d.

## 2021-04-03 NOTE — ED Provider Notes (Signed)
[facility]    CSN: [id number] Arrival date & time: 04/03/21  1156      History   Chief Complaint Chief Complaint  Patient presents with   Generalized Body Aches   Chills    HPI Carly Reyes is a 30 y.o. female presenting with generalized bodyaches and chills x2-3 days. Medical history depression, POTS, bipolar 1.  Throbbing headaches behind forehead. Generalized bodyaches. Fevers as high as 100.4 (triage note erroneously notes 104- patient states this is incorrect). Denies n/v/d, chest pain, cough, congestion, facial pain, teeth pain, loss of taste/smell, swollen lymph nodes, ear pain. States she is not pregnant.     HPI  Past Medical History:  Diagnosis Date   Bipolar 1 disorder (HCC)    Depression    POTS (postural orthostatic tachycardia syndrome) 04/21/2014    Patient Active Problem List   Diagnosis Date Noted   POTS (postural orthostatic tachycardia syndrome) 04/21/2014   Intrauterine pregnancy 02/16/2014   Pregnancy and infectious disease in first trimester 07/07/2013   Bipolar 1 disorder (HCC) 07/07/2013   PALPITATIONS 11/18/2009   SYNCOPE 11/10/2009   CHEST PAIN UNSPECIFIED 11/10/2009    Past Surgical History:  Procedure Laterality Date   TONSILLECTOMY      OB History     Gravida  2   Para  1   Term  1   Preterm      AB  1   Living  1      SAB      IAB  1   Ectopic      Multiple      Live Births  1            Home Medications    Prior to Admission medications   Medication Sig Start Date End Date Taking? Authorizing Provider  lamoTRIgine (LAMICTAL) 200 MG tablet Take 200 mg by mouth daily.    [provider]  nadolol (CORGARD) 20 MG tablet Take 1 tablet (20 mg total) by mouth daily. 02/09/21   Duke Salvia, MD  QUEtiapine (SEROQUEL XR) 300 MG 24 hr tablet Take 300 mg by mouth at bedtime.    [provider]  sertraline (ZOLOFT) 100 MG tablet Take 100 mg by mouth daily.    [provider]    Family History Family History  Problem Relation Age of Onset   Hypertension Mother    Hypertension Father    Heart failure Father    Thyroid disease Father    Graves' disease Father     Social History Social History   Tobacco Use   Smoking status: Former    Pack years: 0.00   Smokeless tobacco: Never  Substance Use Topics   Alcohol use: Yes    Alcohol/week: 1.0 standard drink    Types: 1 Shots of liquor per week    Comment: 03/30/18   Drug use: Yes    Types: Marijuana    Comment: last use 03/30/18     Allergies   Patient has no known allergies.   Review of Systems Review of Systems  Constitutional:  Positive for chills, fatigue and fever. Negative for appetite change.  HENT:  Positive for congestion. Negative for ear pain, rhinorrhea, sinus pressure, sinus pain and sore throat.   Eyes:  Negative for redness and visual disturbance.  Respiratory:  Negative for cough, chest tightness, shortness of breath and wheezing.   Cardiovascular:  Negative for chest pain and palpitations.  Gastrointestinal:  Negative for abdominal  pain, constipation, diarrhea, nausea and vomiting.  Genitourinary:  Negative for dysuria, frequency and urgency.  Musculoskeletal:  Positive for myalgias.  Neurological:  Negative for dizziness, weakness and headaches.  Psychiatric/Behavioral:  Negative for confusion.   All other systems reviewed and are negative.   Physical Exam Triage Vital Signs ED Triage Vitals  Enc Vitals Group     BP 04/03/21 1316 92/66     Pulse Rate 04/03/21 1316 78     Resp 04/03/21 1316 18     Temp 04/03/21 1316 97.9 F (36.6 C)     Temp Source 04/03/21 1316 Oral     SpO2 04/03/21 1316 98 %     Weight --      Height --      Head Circumference --      Peak Flow --      Pain Score 04/03/21 1318 8     Pain Loc --      Pain Edu? --      Excl. in GC? --    No data found.  Updated Vital Signs BP 92/66 (BP Location: Left Arm)   Pulse 78   Temp  97.9 F (36.6 C) (Oral)   Resp 18   SpO2 98%   Breastfeeding No   Visual Acuity Right Eye Distance:   Left Eye Distance:   Bilateral Distance:    Right Eye Near:   Left Eye Near:    Bilateral Near:     Physical Exam Vitals reviewed.  Constitutional:      General: She is not in acute distress.    Appearance: Normal appearance. She is not ill-appearing.  HENT:     Head: Normocephalic and atraumatic.     Right Ear: Hearing, tympanic membrane, ear canal and external ear normal. No swelling or tenderness. There is no impacted cerumen. No mastoid tenderness. Tympanic membrane is not perforated, erythematous, retracted or bulging.     Left Ear: Hearing, tympanic membrane, ear canal and external ear normal. No swelling or tenderness. There is no impacted cerumen. No mastoid tenderness. Tympanic membrane is not perforated, erythematous, retracted or bulging.     Nose:     Right Sinus: No maxillary sinus tenderness or frontal sinus tenderness.     Left Sinus: No maxillary sinus tenderness or frontal sinus tenderness.     Mouth/Throat:     Mouth: Mucous membranes are moist.     Pharynx: Uvula midline. No oropharyngeal exudate or posterior oropharyngeal erythema.     Tonsils: No tonsillar exudate.  Cardiovascular:     Rate and Rhythm: Normal rate and regular rhythm.     Heart sounds: Normal heart sounds.  Pulmonary:     Breath sounds: Normal breath sounds and air entry. No wheezing, rhonchi or rales.  Chest:     Chest wall: No tenderness.  Abdominal:     General: Abdomen is flat. Bowel sounds are normal.     Tenderness: There is no abdominal tenderness. There is no guarding or rebound.  Lymphadenopathy:     Cervical: No cervical adenopathy.  Neurological:     General: No focal deficit present.     Mental Status: She is alert and oriented to person, place, and time.  Psychiatric:        Attention and Perception: Attention and perception normal.        Mood and Affect: Mood and  affect normal.        Behavior: Behavior normal. Behavior is cooperative.  Thought Content: Thought content normal.        Judgment: Judgment normal.     UC Treatments / Results  Labs (all labs ordered are listed, but only abnormal results are displayed) Labs Reviewed - No data to display  EKG   Radiology No results found.  Procedures Procedures (including critical care time)  Medications Ordered in UC Medications - No data to display  Initial Impression / Assessment and Plan / UC Course  I have reviewed the triage vital signs and the nursing notes.  Pertinent labs & imaging results that were available during my care of the patient were reviewed by me and considered in my medical decision making (see chart for details).     This patient is a very pleasant 30 y.o. year old female presenting with febrile illness. Today this pt is afebrile nontachycardic nontachypneic, oxygenating well on room air, no wheezes rhonchi or rales.  Last dose antipyretic 3 hours ago.   Covid and influenza tests sent. Centor score 1 due to fevers at home, rapid strep deferred. History tonsillectomy.   OTC medications for symptomatic relief.  Work note provided. ED return precautions discussed. Patient verbalizes understanding and agreement.    Final Clinical Impressions(s) / UC Diagnoses   Final diagnoses:  Febrile illness  Encounter for screening for COVID-19     Discharge Instructions      -For fevers/chills, bodyaches, headaches- Take Tylenol 1000 mg 3 times daily, and ibuprofen 800 mg 3 times daily with food.  You can take these together, or alternate every 3-4 hours. -Your covid and influenza tests should come back in about 3 days -With a virus, you're typically contagious for 5-7 days, or as long as you're having fevers. This is also how long you'll feel sick. -Come back and see [place] if new symptoms like shortness of breath, weakness, dizziness, ear pain     ED  Prescriptions   None    PDMP not reviewed this encounter.   Rhys Martini, PA-C 04/03/21 1334

## 2021-04-04 LAB — COVID-19, FLU A+B NAA
Influenza A, NAA: NOT DETECTED
Influenza B, NAA: NOT DETECTED
SARS-CoV-2, NAA: DETECTED — AB

## 2021-06-27 ENCOUNTER — Ambulatory Visit (HOSPITAL_COMMUNITY): Payer: 59

## 2021-09-08 ENCOUNTER — Other Ambulatory Visit: Payer: Self-pay

## 2021-09-08 ENCOUNTER — Ambulatory Visit: Admission: EM | Admit: 2021-09-08 | Discharge: 2021-09-08 | Discharge status: Against Medical Advice | Payer: 59

## 2021-09-08 NOTE — ED Notes (Signed)
Called patient in lobby, no answer, called listed phone number, no answer, LM.  TM,CMA 09-08-2021 @ 12:29pm.

## 2021-12-12 ENCOUNTER — Telehealth: Payer: Self-pay | Admitting: Internal Medicine

## 2021-12-12 MED ORDER — NADOLOL 20 MG PO TABS: 20.0000 mg | ORAL_TABLET | Freq: Every day | ORAL | 0 refills | Status: DC

## 2021-12-12 NOTE — Telephone Encounter (Signed)
Pt send this message via Mychart our scheduling pool:   ? ? ?Sorry I came down with covid about 10 days ago and have been delirious.  ? ?I'm completely out of my Nadolol. Is there anyway I can get a temporary refill until we schedule my appointment? ? ?In reference to my symptoms last time we spoke they include extreme brain fog/forgetting/hard to form scentences & fatigue, slow digestion, numbness and tingling in my hands/forearms,  flushing of the skin on my thighs, backs of arms and chest/face/ears and appears as strange splotches, the flushing usually feels like my skin is really close to a heat lamp and itchy, it also seems like my medications take much longer than usual to kick in [ie. 2 hours now vs prior to this beginning] ? ? ?- ?Good Morning Judeth Cornfield,  ?Can you tell me a little more about what is going on.  What type of new symptoms?   ? ? ?Appointment Request From: Carly Reyes ? ?With Provider: Sherryl Manges, MD [facility] Office] ? ?Preferred Date Range: 12/07/2021 - 12/23/2021 ? ?Preferred Times: Monday Morning, Tuesday Morning, Wednesday Morning, Thursday Morning, Friday Morning ? ?Reason for visit: Office Visit ? ?Comments: ?Refill appointment and discuss new symptoms ?

## 2021-12-12 NOTE — Telephone Encounter (Signed)
Spoke with Carly Reyes who states she is out of Nadolol refills.  Carly Reyes states she takes medication as prescribed but does take an extra tablet as needed per recommendation of Dr Caryl Comes.  Carly Reyes advised will have [place] Dr Olin Pia scheduler contact her to schedule a follow up appointment. ?

## 2022-02-12 ENCOUNTER — Other Ambulatory Visit: Payer: Self-pay | Admitting: Internal Medicine

## 2022-02-13 MED ORDER — NADOLOL 20 MG PO TABS: 20.0000 mg | ORAL_TABLET | Freq: Every day | ORAL | 0 refills | Status: DC

## 2022-02-15 ENCOUNTER — Ambulatory Visit: Payer: 59 | Admitting: Internal Medicine

## 2022-02-15 DIAGNOSIS — R002 Palpitations: Secondary | ICD-10-CM

## 2022-02-15 NOTE — Progress Notes (Incomplete)
      Patient Care Team: Patient, No Pcp Per (Inactive) as PCP - General (General Practice)   HPI  Carly Reyes is a 30 y.o. female Seen in followup for symptoms most consistent with POTS ;    Also has anxiety and bipolar disorder these are relatively well compensated The patient denies chest pain, shortness of breath, nocturnal dyspnea, orthopnea or peripheral edema.  There have been no palpitations or syncope.  Some lightheadedness.  Some heat intolerance.  Is exercising has some post exercise lightheadedness but has not fainted now in more than a year.  Has stopped smoking.  No alcohol.  Just taking a new job.  Mental status is doing terrific. Her son is 6 "Carly Reyes"    Past Medical History:  Diagnosis Date   Bipolar 1 disorder (HCC)    Depression    POTS (postural orthostatic tachycardia syndrome) 04/21/2014    Past Surgical History:  Procedure Laterality Date   TONSILLECTOMY      Current Outpatient Medications  Medication Sig Dispense Refill   lamoTRIgine (LAMICTAL) 200 MG tablet Take 200 mg by mouth daily.     nadolol (CORGARD) 20 MG tablet Take 1 tablet (20 mg total) by mouth daily. Please keep upcoming appointment for future refills. Thank you. 30 tablet 0   QUEtiapine (SEROQUEL XR) 300 MG 24 hr tablet Take 300 mg by mouth at bedtime.     sertraline (ZOLOFT) 100 MG tablet Take 100 mg by mouth daily.     No current facility-administered medications for this visit.    No Known Allergies  Review of Systems negative except from HPI and PMH  Physical Exam There were no vitals taken for this visit. Well developed and nourished in no acute distress HENT normal Neck supple with JVP-  flat   Clear Regular rate and rhythm, no murmurs or gallops Abd-soft with active BS No Clubbing cyanosis edema Skin-warm and dry A & Oriented  Grossly normal sensory and motor function  ECG sinus at 68 Intervals 09/01/1939  Assessment and   Plan  POTS  Bipolar/anxiety  Fatigue     Overall she is doing exceedingly well.  Orthostatics were normal today.  Reviewed the importance of hydration salt and exercise.  She has stopped smoking.  HOORAY   We will refill her nadolol for her palpitations.  20 mg daily.

## 2022-02-22 ENCOUNTER — Other Ambulatory Visit: Payer: Self-pay | Admitting: Internal Medicine

## 2022-02-22 ENCOUNTER — Encounter (INDEPENDENT_AMBULATORY_CARE_PROVIDER_SITE_OTHER): Payer: Self-pay

## 2022-02-23 ENCOUNTER — Telehealth: Payer: Self-pay | Admitting: Internal Medicine

## 2022-02-23 MED ORDER — NADOLOL 20 MG PO TABS: 20.0000 mg | ORAL_TABLET | Freq: Every day | ORAL | 0 refills | Status: DC

## 2022-02-23 NOTE — Telephone Encounter (Signed)
*  STAT* If patient is at the pharmacy, call can be transferred to refill team.   1. Which medications need to be refilled? (please list name of each medication and dose if known) nadolol (CORGARD) 20 MG tablet  2. Which pharmacy/location (including street and city if local pharmacy) is medication to be sent to? [id number] - [place] ([place]), [place] - [address]  3. Do they need a 30 day or 90 day supply? 30 day  Patient has an appointment 03/31/2022. Patient is out of medication.

## 2022-02-23 NOTE — Telephone Encounter (Signed)
Pt's medication was sent to pt's pharmacy as requested. Confirmation received.  °

## 2022-03-31 ENCOUNTER — Ambulatory Visit: Payer: 59 | Admitting: Internal Medicine

## 2022-04-13 ENCOUNTER — Ambulatory Visit: Payer: 59 | Admitting: Internal Medicine

## 2022-04-13 ENCOUNTER — Other Ambulatory Visit: Payer: Self-pay | Admitting: Internal Medicine

## 2022-04-13 ENCOUNTER — Encounter: Payer: Self-pay | Admitting: Internal Medicine

## 2022-04-13 VITALS — Ht 67.0 in | Wt 126.2 lb

## 2022-04-13 DIAGNOSIS — G90A Postural orthostatic tachycardia syndrome (POTS): Secondary | ICD-10-CM | POA: Diagnosis not present

## 2022-04-13 DIAGNOSIS — R002 Palpitations: Secondary | ICD-10-CM | POA: Diagnosis not present

## 2022-04-13 NOTE — Progress Notes (Signed)
      Patient Care Team: Patient, No Pcp Per as PCP - General (General Practice)   HPI  Carly Reyes is a 31 y.o. female Seen in followup for symptoms most consistent with POTS ; see a full so marginal her full service.  Also has anxiety and bipolar disorder these are relatively well compensated   Recently lost her job.  Hot environment was intolerable.  This is quite a challenge.  He has been very difficult this summer.  Diet is replete the sodium and fluid.  Salt supplementation is expensive. Her son Carly Reyes is now 8    Past Medical History:  Diagnosis Date   Bipolar 1 disorder (HCC)    Depression    POTS (postural orthostatic tachycardia syndrome) 04/21/2014    Past Surgical History:  Procedure Laterality Date   TONSILLECTOMY      Current Outpatient Medications  Medication Sig Dispense Refill   nadolol (CORGARD) 20 MG tablet Take 1 tablet (20 mg total) by mouth daily. Please keep upcoming appointment for future refills. Thank you. 30 tablet 0   QUEtiapine (SEROQUEL) 100 MG tablet Take 150 mg by mouth at bedtime.     sertraline (ZOLOFT) 100 MG tablet Take 100 mg by mouth daily.     No current facility-administered medications for this visit.    No Known Allergies  Review of Systems negative except from HPI and PMH  Physical Exam Ht 5\' 7"  (1.702 m)   Wt 126 lb 3.2 oz (57.2 kg)   LMP 03/27/2022   SpO2 99%   BMI 19.77 kg/m  Well developed and nourished in no acute distress HENT normal Neck supple with JVP-  flat   Clear Regular rate and rhythm, no murmurs or gallops Abd-soft with active BS No Clubbing cyanosis edema Skin-warm and dry A & Oriented  Grossly normal sensory and motor function  ECG sinus at 72 Intervals 09/01/1940  Assessment and  Plan  POTS  Bipolar/anxiety  Fatigue   Recent aggravation of her symptoms.  Was working in a very hot environment.  She is now over. Discussed the role of abdominal and thigh compression.  Looked at  various salt supplements and will try 1-2 teaspoons of salt and some apple juice; right now her salt supplementation is pretty expensive.

## 2022-04-13 NOTE — Patient Instructions (Signed)
Medication Instructions:  Your physician recommends that you continue on your current medications as directed. Please refer to the Current Medication list given to you today.  *If you need a refill on your cardiac medications before your next appointment, please call your pharmacy*   Lab Work: None ordered.  If you have labs (blood work) drawn today and your tests are completely normal, you will receive your results only by: MyChart Message (if you have MyChart) OR A paper copy in the mail If you have any lab test that is abnormal or we need to change your treatment, we will call you to review the results.   Testing/Procedures: None ordered.    Follow-Up: At [facility], you and your health needs are our priority.  As part of our continuing mission to provide you with exceptional heart care, we have created designated Provider Care Teams.  These Care Teams include your primary Cardiologist (physician) and Advanced Practice Providers (APPs -  Physician Assistants and Nurse Practitioners) who all work together to provide you with the care you need, when you need it.  We recommend signing up for the patient portal called "MyChart".  Sign up information is provided on this After Visit Summary.  MyChart is used to connect with patients for Virtual Visits (Telemedicine).  Patients are able to view lab/test results, encounter notes, upcoming appointments, etc.  Non-urgent messages can be sent to your provider as well.   To learn more about what you can do with MyChart, go to [email or web address].    Your next appointment:   12 months with Dr Marline Backbone    Use abdominal and thigh compression as discussed by Dr Graciela Husbands  Important Information About Sugar

## 2023-02-08 ENCOUNTER — Other Ambulatory Visit: Payer: Self-pay | Admitting: Internal Medicine

## 2023-02-08 ENCOUNTER — Telehealth: Payer: Self-pay | Admitting: Internal Medicine

## 2023-02-08 NOTE — Telephone Encounter (Signed)
*  STAT* If patient is at the pharmacy, call can be transferred to refill team.   1. Which medications need to be refilled? (please list name of each medication and dose if known) nadolol (CORGARD) 20 MG tablet   2. Which pharmacy/location (including street and city if local pharmacy) is medication to be sent to?  [id number] - [place] ([place]), [place] - [address]    3. Do they need a 30 day or 90 day supply? 90 day

## 2023-02-08 NOTE — Telephone Encounter (Signed)
Prescription sent to pharmacy.

## 2023-07-02 ENCOUNTER — Ambulatory Visit: Payer: 59 | Attending: Internal Medicine | Admitting: Internal Medicine

## 2023-07-03 ENCOUNTER — Encounter: Payer: Self-pay | Admitting: Internal Medicine

## 2023-08-29 ENCOUNTER — Encounter: Payer: Self-pay | Admitting: Internal Medicine

## 2023-08-31 NOTE — Telephone Encounter (Signed)
Spoke with pt regarding MyChart messages. She stated she has not had any CP, SOB, dizziness today. She stated that those s/s typically occur when her blood pressure is elevated. She said she has not taken her BP today so told her to take her BP reading and let us know what her BP and HR are. Explained to pt that Dr. Graciela Husbands is not in the clinic today but I would be sending her messages to him to see if we need to make medication changes. Advised pt that if these episodes start again, it would be best to go to urgent care to be seen since she does not have a PCP and see if an EKG needs to be done. Pt verbalized understanding and stated she would take a BP reading and send [place] the results through MyChart.

## 2023-09-18 ENCOUNTER — Other Ambulatory Visit: Payer: Self-pay | Admitting: Internal Medicine

## 2023-11-19 ENCOUNTER — Encounter: Payer: Self-pay | Admitting: Internal Medicine

## 2023-11-19 ENCOUNTER — Other Ambulatory Visit: Payer: Self-pay | Admitting: Internal Medicine

## 2023-11-19 ENCOUNTER — Ambulatory Visit: Payer: 59 | Attending: Internal Medicine | Admitting: Internal Medicine

## 2023-11-19 VITALS — BP 120/79 | HR 87 | Ht 67.0 in | Wt 147.4 lb

## 2023-11-19 DIAGNOSIS — G90A Postural orthostatic tachycardia syndrome (POTS): Secondary | ICD-10-CM

## 2023-11-19 NOTE — Patient Instructions (Signed)
 Medication Instructions:   Your physician recommends that you continue on your current medications as directed. Please refer to the Current Medication list given to you today.  *If you need a refill on your cardiac medications before your next appointment, please call your pharmacy*   Lab Work: None ordered.  If you have labs (blood work) drawn today and your tests are completely normal, you will receive your results only by: MyChart Message (if you have MyChart) OR A paper copy in the mail If you have any lab test that is abnormal or we need to change your treatment, we will call you to review the results.   Testing/Procedures: None ordered.    Follow-Up: At [facility], you and your health needs are our priority.  As part of our continuing mission to provide you with exceptional heart care, we have created designated Provider Care Teams.  These Care Teams include your primary Cardiologist (physician) and Advanced Practice Providers (APPs -  Physician Assistants and Nurse Practitioners) who all work together to provide you with the care you need, when you need it.  We recommend signing up for the patient portal called "MyChart".  Sign up information is provided on this After Visit Summary.  MyChart is used to connect with patients for Virtual Visits (Telemedicine).  Patients are able to view lab/test results, encounter notes, upcoming appointments, etc.  Non-urgent messages can be sent to your provider as well.   To learn more about what you can do with MyChart, go to [email or web address].    Your next appointment:   6 months with Dr Graciela Husbands  Will refer to Physical Therapist

## 2023-11-19 NOTE — Progress Notes (Unsigned)
      Patient Care Team: Patient, No Pcp Per as PCP - General (General Practice)   HPI  Carly Reyes is a 33 y.o. 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  Has not worked since.  She has been diagnosed with joint hypermobility.  Significant pain awakens her at night from sleep.  Struggling with fatigue.  Episodes of lightheadedness prompting her to use her carrying stool a couple times a month.  Spells can last 15-30 minutes can be relieved by recumbency raising her legs; no clear provocateurs  Salt intake is pretty good using a tablespoon of Himalayan salt (about 6 and half grams) fluid intake somewhat deplete.  Not exercising  Socially isolated   Her son Carly Reyes is now 8    Past Medical History:  Diagnosis Date   Bipolar 1 disorder (HCC)    Depression    POTS (postural orthostatic tachycardia syndrome) 04/21/2014    Past Surgical History:  Procedure Laterality Date   TONSILLECTOMY      Current Outpatient Medications  Medication Sig Dispense Refill   lamoTRIgine (LAMICTAL) 200 MG tablet Take 200 mg by mouth daily.     nadolol (CORGARD) 20 MG tablet Take 1 tablet by mouth once daily 90 tablet 0   QUEtiapine (SEROQUEL) 100 MG tablet Take 200 mg by mouth at bedtime.     sertraline (ZOLOFT) 100 MG tablet Take 100 mg by mouth daily.     No current facility-administered medications for this visit.    No Known Allergies  Review of Systems negative except from HPI and PMH  Physical Exam BP 120/79 Comment: standing at 3 mins  Pulse 87   Ht 5\' 7"  (1.702 m)   Wt 147 lb 6.4 oz (66.9 kg)   LMP 11/09/2023   SpO2 99%   BMI 23.09 kg/m  Well developed and nourished in no acute distress HENT normal Neck supple Clear Regular rate and rhythm, no murmurs or gallops Abd-soft with active BS No Clubbing  cyanosis edema Skin-warm and dry A & Oriented  Grossly normal sensory and motor function  ECG sinus @ 81  13/07/39 was  Assessment and  Plan  POTS  Bipolar/anxiety  Fatigue  Joint hypermobility syndrome     Patient's POTS and vital signs today were normal.  This in the context of good sodium repletion and low-dose beta-blockade.  Continues to have symptoms not withstanding.  Reviewed the importance of exercise.  Have given her the name of the [facility] protocol and encouraged her to go back to the gym with recumbent exercise.  Also discussed the potential benefits of compression.  Will reach out to colleagues for recommendations regarding physical therapy for her joint hypermobility likely contribute to her sleep issues  Spoke about the detriments of social isolation

## 2024-05-13 ENCOUNTER — Telehealth: Payer: Self-pay | Admitting: Nurse Practitioner

## 2024-05-13 DIAGNOSIS — K047 Periapical abscess without sinus: Secondary | ICD-10-CM

## 2024-05-13 MED ORDER — PENICILLIN V POTASSIUM 500 MG PO TABS: 500.0000 mg | ORAL_TABLET | Freq: Three times a day (TID) | ORAL | 0 refills | Status: AC

## 2024-05-13 NOTE — Progress Notes (Signed)
 Virtual Visit Consent   [name], you are scheduled for a virtual visit with a [facility] provider today. Just as with appointments in the office, your consent must be obtained to participate. Your consent will be active for this visit and any virtual visit you may have with one of our providers in the next 365 days. If you have a MyChart account, a copy of this consent can be sent to you electronically.  As this is a virtual visit, video technology does not allow for your provider to perform a traditional examination. This may limit your provider's ability to fully assess your condition. If your provider identifies any concerns that need to be evaluated in person or the need to arrange testing (such as labs, EKG, etc.), we will make arrangements to do so. Although advances in technology are sophisticated, we cannot ensure that it will always work on either your end or our end. If the connection with a video visit is poor, the visit may have to be switched to a telephone visit. With either a video or telephone visit, we are not always able to ensure that we have a secure connection.  By engaging in this virtual visit, you consent to the provision of healthcare and authorize for your insurance to be billed (if applicable) for the services provided during this visit. Depending on your insurance coverage, you may receive a charge related to this service.  I need to obtain your verbal consent now. Are you willing to proceed with your visit today? [name] has provided verbal consent on 05/13/2024 for a virtual visit (video or telephone). Lauraine Kitty, FNP  Date: 05/13/2024 7:19 PM   Virtual Visit via Video Note   I, Lauraine Kitty, connected with  Carly Reyes  ([id number], 06/20/1991) on 05/13/24 at  7:30 PM EDT by a video-enabled telemedicine application and verified that I am speaking with the correct person using two identifiers.  Location: Patient: Virtual Visit Location  Patient: Home Provider: Virtual Visit Location Provider: Home Office   I discussed the limitations of evaluation and management by telemedicine and the availability of in person appointments. The patient expressed understanding and agreed to proceed.    History of Present Illness: Carly Reyes is a 33 y.o. who identifies as a female who was assigned female at birth, and is being seen today for a dental abscess   She has been suffering from pain in her molar on the upper right. She did have a root canal about 15 years ago. She feels that under it she notes some swelling and the tooth hurts when she puts pressure on the tooth. Denies sensitivity to cold/heat   It has been 2 years since her most recent dental infection and was treated UC   Problems:  Patient Active Problem List   Diagnosis Date Noted   POTS (postural orthostatic tachycardia syndrome) 04/21/2014   Intrauterine pregnancy 02/16/2014   Pregnancy and infectious disease in first trimester 07/07/2013   Bipolar 1 disorder (HCC) 07/07/2013   PALPITATIONS 11/18/2009   SYNCOPE 11/10/2009   CHEST PAIN UNSPECIFIED 11/10/2009    Allergies: No Known Allergies Medications:  Current Outpatient Medications:    lamoTRIgine  (LAMICTAL ) 200 MG tablet, Take 200 mg by mouth daily., Disp: , Rfl:    nadolol  (CORGARD ) 20 MG tablet, Take 1 tablet by mouth once daily, Disp: 90 tablet, Rfl: 3   QUEtiapine  (SEROQUEL ) 100 MG tablet, Take 200 mg by mouth at bedtime., Disp: , Rfl:  sertraline  (ZOLOFT ) 100 MG tablet, Take 100 mg by mouth daily., Disp: , Rfl:   Observations/Objective: Patient is well-developed, well-nourished in no acute distress.  Resting comfortably  at home.  Head is normocephalic, atraumatic.  No labored breathing.  Speech is clear and coherent with logical content.  Patient is alert and oriented at baseline.    Assessment and Plan:   1. Dental infection (Primary)  - penicillin  v potassium (VEETID) 500 MG tablet;  Take 1 tablet (500 mg total) by mouth 3 (three) times daily for 7 days.  Dispense: 21 tablet; Refill: 0    Follow up with dentist in the next week as discussed   Follow Up Instructions: I discussed the assessment and treatment plan with the patient. The patient was provided an opportunity to ask questions and all were answered. The patient agreed with the plan and demonstrated an understanding of the instructions.  A copy of instructions were sent to the patient via MyChart unless otherwise noted below.    The patient was advised to call back or seek an in-person evaluation if the symptoms worsen or if the condition fails to improve as anticipated.    Lauraine Kitty, FNP

## 2024-06-11 ENCOUNTER — Other Ambulatory Visit: Payer: Self-pay | Admitting: Internal Medicine

## 2024-08-05 ENCOUNTER — Telehealth: Payer: Self-pay | Admitting: Internal Medicine

## 2024-08-05 NOTE — Telephone Encounter (Signed)
 Returned call to pt to verify she picked her refill up from 06/11/24 for 90 days supply Nadolol .  Asked pt to call back or send a mychart message.

## 2024-08-05 NOTE — Telephone Encounter (Signed)
*  STAT* If patient is at the pharmacy, call can be transferred to refill team.   1. Which medications need to be refilled? (please list name of each medication and dose if known)   nadolol  (CORGARD ) 20 MG tablet     2. Would you like to learn more about the convenience, safety, & potential cost savings by using the [facility]? no    3. Are you open to using the [facility] (Type [facility]. ). No     4. Which pharmacy/location (including street and city if local pharmacy) is medication to be sent to?[id number] - [place] ([place]), [place] - [address]    5. Do they need a 30 day or 90 day supply? 30 day    Pt is out of medication

## 2024-08-06 MED ORDER — NADOLOL 20 MG PO TABS: 20.0000 mg | ORAL_TABLET | Freq: Two times a day (BID) | ORAL | 2 refills | Status: AC

## 2024-08-06 NOTE — Telephone Encounter (Signed)
 Ok to refill and continue as she has been taking. Ok to give 2 months refills to get her to the next appointment.    Thank you,  Emeline Calender, DO   Rx(s) sent to pharmacy electronically.

## 2024-08-06 NOTE — Telephone Encounter (Signed)
 Update sent to patient via MyChart

## 2024-08-06 NOTE — Addendum Note (Signed)
 Addended by: LORING ANDRIETTE HERO on: 08/06/2024 04:41 PM   Modules accepted: Orders

## 2024-08-06 NOTE — Telephone Encounter (Addendum)
 Spoke with patient. She said Dr. Fernande had told her OK to take extra as needed, at least doing this for 2 years. Prescription has never been updated. He has since retired.  Routed to new cardiologist, Dr. Kriste, for OK on Rx which is pended.

## 2024-08-06 NOTE — Telephone Encounter (Signed)
 Patient called back to say, that she does take 2 a day at times. She stated that the dr told her she could if needed. Please advise

## 2024-08-06 NOTE — Addendum Note (Signed)
 Addended by: LORING ANDRIETTE HERO on: 08/06/2024 12:52 PM   Modules accepted: Orders

## 2024-09-22 ENCOUNTER — Encounter: Payer: Self-pay | Admitting: *Deleted

## 2024-09-26 ENCOUNTER — Ambulatory Visit: Payer: Self-pay | Attending: Internal Medicine | Admitting: Internal Medicine

## 2024-09-26 NOTE — Progress Notes (Deleted)
" °  Cardiology Office Note:  .   Date:  09/26/2024  ID:  Carly Reyes, DOB [birth year], MRN [id number] PCP: Patient, No Pcp Per  [facility] Providers Cardiologist:  None { Click to update primary MD,subspecialty MD or APP then REFRESH:1}   History of Present Illness: .   Carly Reyes is a 34 y.o. female Discussed the use of AI scribe software for clinical note transcription with the patient, who gave verbal consent to proceed.  History of Present Illness       ROS: ***  Studies Reviewed: .        Results  Risk Assessment/Calculations:   {Does this patient have ATRIAL FIBRILLATION?:[phone]} No BP recorded.  {Refresh Note OR Click here to enter BP  :1}***       Physical Exam:   VS:  There were no vitals taken for this visit.   Wt Readings from Last 3 Encounters:  11/19/23 147 lb 6.4 oz (66.9 kg)  04/13/22 126 lb 3.2 oz (57.2 kg)  02/09/21 127 lb (57.6 kg)    GEN: Well nourished, well developed in no acute distress NECK: No JVD; No carotid bruits CARDIAC: ***RRR, no murmurs, no rubs, no gallops RESPIRATORY:  Clear to auscultation without rales, wheezing or rhonchi  ABDOMEN: Soft, non-tender, non-distended EXTREMITIES:  No edema; No deformity   ASSESSMENT AND PLAN: .    Assessment and Plan Assessment & Plan        {Are you ordering a CV Procedure (e.g. stress test, cath, DCCV, TEE, etc)?   Press F2        :[id number]}    Follow up: ***  Signed, Emeline FORBES Calender, DO  09/26/2024 8:08 AM    [facility] "
